# Patient Record
Sex: Female | Born: 1980 | Race: White | Hispanic: No | Smoking: Never smoker
Health system: Southern US, Community
[De-identification: ages and names within clinical notes are randomized; demographics above are authoritative.]

## PROBLEM LIST (undated history)

## (undated) DIAGNOSIS — G43909 Migraine, unspecified, not intractable, without status migrainosus: Secondary | ICD-10-CM

## (undated) DIAGNOSIS — J45909 Unspecified asthma, uncomplicated: Secondary | ICD-10-CM

## (undated) HISTORY — PX: MOUTH SURGERY: SHX715

---

## 2008-01-30 ENCOUNTER — Emergency Department (HOSPITAL_COMMUNITY): Admission: EM | Admit: 2008-01-30 | Discharge: 2008-01-30 | Payer: Self-pay | Admitting: Emergency Medicine

## 2011-03-07 ENCOUNTER — Encounter: Payer: Self-pay | Admitting: Family Medicine

## 2011-03-07 ENCOUNTER — Ambulatory Visit (INDEPENDENT_AMBULATORY_CARE_PROVIDER_SITE_OTHER): Payer: PRIVATE HEALTH INSURANCE | Admitting: Family Medicine

## 2011-03-07 DIAGNOSIS — G43109 Migraine with aura, not intractable, without status migrainosus: Secondary | ICD-10-CM | POA: Insufficient documentation

## 2011-03-07 DIAGNOSIS — G43909 Migraine, unspecified, not intractable, without status migrainosus: Secondary | ICD-10-CM

## 2011-03-07 MED ORDER — SUMATRIPTAN SUCCINATE 6 MG/0.5ML ~~LOC~~ SOLN
6.0000 mg | Freq: Once | SUBCUTANEOUS | Status: AC
  Administered 2011-03-07: 6 mg via SUBCUTANEOUS

## 2011-03-07 MED ORDER — SUMATRIPTAN SUCCINATE 100 MG PO TABS: 100.0000 mg | ORAL_TABLET | ORAL | Status: DC | PRN

## 2011-03-07 NOTE — Assessment & Plan Note (Addendum)
Given sumatriptan 6 mg SQ x 1 and ibuprofen 800 mg in clinic.  Given script for sumatriptan for abortive tx.  No red flags for medication overuse headache,  intracranial or infectious etiology.  Discussed risk of oral contraceptive in migraine with aura.

## 2011-03-07 NOTE — Progress Notes (Signed)
  Subjective:    Patient ID: Stacey Fox, female    DOB: 1981/02/07, 30 y.o.   MRN: [id number]  HPI History of migraines with aura.  Had another episode starting 5 days ago which was like typical migraine.  Has vision changes with aura of left arm numbness and neck pain.  This resolved by 2 days ago.  Today, had another migrained with nausea, diarrhea, photosensetivity, "jitteryness".  No fever.  No vision changes.    In past has been on prevention with topamax and lamictal.  Has not had a migraine in many months.  Review of Systems see hpi   Objective:   Physical Exam  Constitutional: She is oriented to person, place, and time. She appears well-developed and well-nourished.  HENT:  Head: Normocephalic.  Eyes: Conjunctivae and EOM are normal. Pupils are equal, round, and reactive to light.  Fundoscopic exam:      The right eye shows no arteriolar narrowing, no AV nicking, no hemorrhage and no papilledema. The right eye shows no red reflex.      The left eye shows no arteriolar narrowing, no AV nicking, no hemorrhage and no papilledema. The left eye shows no red reflex. Cardiovascular: Normal rate and regular rhythm.   No murmur heard. Pulmonary/Chest: Effort normal and breath sounds normal. She has no wheezes. She has no rales.  Abdominal: Soft. She exhibits no distension.  Neurological: She is alert and oriented to person, place, and time. She displays normal reflexes. No cranial nerve deficit. She exhibits normal muscle tone. Coordination normal.          Assessment & Plan:

## 2011-03-15 ENCOUNTER — Ambulatory Visit (INDEPENDENT_AMBULATORY_CARE_PROVIDER_SITE_OTHER): Payer: PRIVATE HEALTH INSURANCE | Admitting: Family Medicine

## 2011-03-15 VITALS — BP 119/78 | HR 102 | Temp 100.2°F | Wt 133.8 lb

## 2011-03-15 DIAGNOSIS — J029 Acute pharyngitis, unspecified: Secondary | ICD-10-CM

## 2011-03-15 DIAGNOSIS — J020 Streptococcal pharyngitis: Secondary | ICD-10-CM

## 2011-03-15 MED ORDER — CEPHALEXIN 500 MG PO CAPS: 500.0000 mg | ORAL_CAPSULE | Freq: Two times a day (BID) | ORAL | Status: AC

## 2011-03-15 NOTE — Patient Instructions (Signed)
I hope you feel better!  - Dr. Wallene Huh     Strep Infections Streptococcal (strep) infections are caused by streptococcal germs (bacteria). Strep infections are very contagious. Strep infections can occur in:  Ears.  The nose.   The throat.   Sinuses.   Skin.   Blood.  Lungs.   Spinal fluid.   Urine.   Strep throat is the most common bacterial infection in children. The symptoms of a Strep infection usually get better in 2 to 3 days after starting medicine that kills germs (antibiotics). Strep is usually not contagious after 36 to 48 hours of antibiotic treatment. Strep infections that are not treated can cause serious complications. These include gland infections, throat abscess, rheumatic fever and kidney disease. DIAGNOSIS The diagnosis of strep is made by:  A culture for the strep germ.  TREATMENT These infections require oral antibiotics for a full 10 days, an antibiotic shot or antibiotics given into the vein (intravenous, IV). HOME CARE INSTRUCTIONS  Be sure to finish all antibiotics even if feeling better.   Only take over-the-counter medicines for pain, discomfort and or fever, as directed by your caregiver.   Close contacts that have a fever, sore throat or illness symptoms should see their caregiver right away.   You or your child may return to work, school or daycare if the fever and pain are better in 2 to 3 days after starting antibiotics.  SEEK MEDICAL CARE IF:  You or your child has an oral temperature above 102 F (38.9 C).   Your baby is older than 3 months with a rectal temperature of 100.5 F (38.1 C) or higher for more than 1 day.   You or your child is not better in 3 days.  SEEK IMMEDIATE MEDICAL CARE IF:  You or your child has an oral temperature above 102 F (38.9 C), not controlled by medicine.   Your baby is older than 3 months with a rectal temperature of 102 F (38.9 C) or higher.   Your baby is 81 months old or younger with a rectal  temperature of 100.4 F (38 C) or higher.   There is a spreading rash.   There is difficulty swallowing or breathing.   There is increased pain or swelling.  Document Released: 01/04/2005 Document Re-Released: 05/17/2010 [employer] Patient Information 2011 [place], [place].

## 2011-03-15 NOTE — Progress Notes (Signed)
  Subjective:    Patient ID: Stacey Fox, female    DOB: 1981/05/27, 30 y.o.   MRN: [id number]  Sore Throat  This is a new problem. Episode onset: 2 days ago  The problem has been gradually worsening. Neither side of throat is experiencing more pain than the other. The maximum temperature recorded prior to her arrival was 100 - 100.9 F. The fever has been present for 1 to 2 days. The pain is moderate. Associated symptoms include neck pain, swollen glands and trouble swallowing. Pertinent negatives include no abdominal pain, congestion, coughing, diarrhea, drooling, ear discharge, ear pain, headaches, hoarse voice, plugged ear sensation, shortness of breath, stridor or vomiting. She has had exposure to strep. She has had no exposure to mono. She has tried acetaminophen, NSAIDs and oral narcotic analgesics for the symptoms. The treatment provided mild relief.      Review of Systems  HENT: Positive for trouble swallowing and neck pain. Negative for ear pain, congestion, hoarse voice, drooling and ear discharge.   Respiratory: Negative for cough, shortness of breath and stridor.   Gastrointestinal: Negative for vomiting, abdominal pain and diarrhea.  Neurological: Negative for headaches.       Objective:   Physical Exam  Constitutional: She appears well-developed and well-nourished. No distress.       Appears to be feeling ill.   HENT:  Head: Normocephalic.  Right Ear: External ear normal.  Left Ear: External ear normal.  Nose: Nose normal.  Mouth/Throat: Oropharyngeal exudate present.  Eyes: Conjunctivae are normal. Right eye exhibits no discharge. Left eye exhibits no discharge.  Neck: Normal range of motion. Neck supple.  Pulmonary/Chest: Effort normal and breath sounds normal. No stridor. No respiratory distress. She has no wheezes.  Abdominal: Soft. Bowel sounds are normal.  Lymphadenopathy:    She has cervical adenopathy.  Neurological: She is alert.  Skin: Skin is warm and dry.           Assessment & Plan:

## 2011-03-15 NOTE — Assessment & Plan Note (Signed)
Strep positive. Handout on strep pharyngitis given. Patient with mild PCN allergy but reports that she has take Keflex in the past. Will treat with Keflex today. Red flags reviewed. Follow up prn.

## 2012-09-19 ENCOUNTER — Ambulatory Visit: Payer: Self-pay | Admitting: Internal Medicine

## 2012-09-19 ENCOUNTER — Ambulatory Visit: Payer: Self-pay | Admitting: Hematology and Oncology

## 2012-09-19 LAB — CBC CANCER CENTER
Basophil %: 0.3 %
Eosinophil #: 0 x10 3/mm (ref 0.0–0.7)
Eosinophil %: 0.4 %
HCT: 42.9 % (ref 35.0–47.0)
Lymphocyte %: 15.5 %
MCH: 30.1 pg (ref 26.0–34.0)
MCHC: 33.1 g/dL (ref 32.0–36.0)
Monocyte %: 9.7 %
Neutrophil #: 4.8 x10 3/mm (ref 1.4–6.5)
RDW: 13 % (ref 11.5–14.5)

## 2012-10-11 ENCOUNTER — Ambulatory Visit: Payer: Self-pay | Admitting: Internal Medicine

## 2012-10-11 ENCOUNTER — Ambulatory Visit: Payer: Self-pay | Admitting: Hematology and Oncology

## 2016-10-21 LAB — GLUCOSE, POCT (MANUAL RESULT ENTRY): POC Glucose: 96 mg/dl (ref 70–99)

## 2017-12-28 DIAGNOSIS — Z13220 Encounter for screening for lipoid disorders: Secondary | ICD-10-CM | POA: Diagnosis not present

## 2017-12-28 DIAGNOSIS — Z0000 Encounter for general adult medical examination without abnormal findings: Secondary | ICD-10-CM | POA: Diagnosis not present

## 2017-12-28 DIAGNOSIS — R109 Unspecified abdominal pain: Secondary | ICD-10-CM | POA: Diagnosis not present

## 2017-12-28 DIAGNOSIS — Z131 Encounter for screening for diabetes mellitus: Secondary | ICD-10-CM | POA: Diagnosis not present

## 2017-12-28 DIAGNOSIS — Z1151 Encounter for screening for human papillomavirus (HPV): Secondary | ICD-10-CM | POA: Diagnosis not present

## 2017-12-28 DIAGNOSIS — Z118 Encounter for screening for other infectious and parasitic diseases: Secondary | ICD-10-CM | POA: Diagnosis not present

## 2017-12-28 DIAGNOSIS — Z3200 Encounter for pregnancy test, result unknown: Secondary | ICD-10-CM | POA: Diagnosis not present

## 2017-12-28 DIAGNOSIS — Z01419 Encounter for gynecological examination (general) (routine) without abnormal findings: Secondary | ICD-10-CM | POA: Diagnosis not present

## 2017-12-28 DIAGNOSIS — Z1329 Encounter for screening for other suspected endocrine disorder: Secondary | ICD-10-CM | POA: Diagnosis not present

## 2017-12-28 DIAGNOSIS — R1031 Right lower quadrant pain: Secondary | ICD-10-CM | POA: Diagnosis not present

## 2017-12-28 DIAGNOSIS — Z130 Encounter for screening for diseases of the blood and blood-forming organs and certain disorders involving the immune mechanism: Secondary | ICD-10-CM | POA: Diagnosis not present

## 2017-12-28 DIAGNOSIS — Z6824 Body mass index (BMI) 24.0-24.9, adult: Secondary | ICD-10-CM | POA: Diagnosis not present

## 2018-01-16 DIAGNOSIS — B977 Papillomavirus as the cause of diseases classified elsewhere: Secondary | ICD-10-CM | POA: Diagnosis not present

## 2018-01-16 DIAGNOSIS — N72 Inflammatory disease of cervix uteri: Secondary | ICD-10-CM | POA: Diagnosis not present

## 2018-01-16 DIAGNOSIS — J302 Other seasonal allergic rhinitis: Secondary | ICD-10-CM | POA: Diagnosis not present

## 2018-01-16 DIAGNOSIS — Z3200 Encounter for pregnancy test, result unknown: Secondary | ICD-10-CM | POA: Diagnosis not present

## 2018-03-14 DIAGNOSIS — R1031 Right lower quadrant pain: Secondary | ICD-10-CM | POA: Diagnosis not present

## 2018-04-02 DIAGNOSIS — R194 Change in bowel habit: Secondary | ICD-10-CM | POA: Diagnosis not present

## 2018-04-02 DIAGNOSIS — R1031 Right lower quadrant pain: Secondary | ICD-10-CM | POA: Diagnosis not present

## 2018-04-02 DIAGNOSIS — Z1211 Encounter for screening for malignant neoplasm of colon: Secondary | ICD-10-CM | POA: Diagnosis not present

## 2018-04-02 DIAGNOSIS — K625 Hemorrhage of anus and rectum: Secondary | ICD-10-CM | POA: Diagnosis not present

## 2018-04-15 ENCOUNTER — Other Ambulatory Visit: Payer: Self-pay | Admitting: Gastroenterology

## 2018-04-15 DIAGNOSIS — R194 Change in bowel habit: Secondary | ICD-10-CM | POA: Diagnosis not present

## 2018-04-15 DIAGNOSIS — Z1211 Encounter for screening for malignant neoplasm of colon: Secondary | ICD-10-CM | POA: Diagnosis not present

## 2018-04-15 DIAGNOSIS — K625 Hemorrhage of anus and rectum: Secondary | ICD-10-CM | POA: Diagnosis not present

## 2018-04-15 DIAGNOSIS — R1031 Right lower quadrant pain: Secondary | ICD-10-CM

## 2018-07-05 ENCOUNTER — Encounter (HOSPITAL_COMMUNITY): Payer: Self-pay | Admitting: Emergency Medicine

## 2018-07-05 ENCOUNTER — Ambulatory Visit (HOSPITAL_COMMUNITY)
Admission: EM | Admit: 2018-07-05 | Discharge: 2018-07-05 | Disposition: A | Discharge status: Home / Self Care | Payer: BLUE CROSS/BLUE SHIELD | Attending: Family Medicine | Admitting: Family Medicine

## 2018-07-05 ENCOUNTER — Other Ambulatory Visit: Payer: Self-pay

## 2018-07-05 DIAGNOSIS — T148XXA Other injury of unspecified body region, initial encounter: Secondary | ICD-10-CM

## 2018-07-05 DIAGNOSIS — S41009A Unspecified open wound of unspecified shoulder, initial encounter: Secondary | ICD-10-CM | POA: Diagnosis not present

## 2018-07-05 DIAGNOSIS — L089 Local infection of the skin and subcutaneous tissue, unspecified: Secondary | ICD-10-CM

## 2018-07-05 MED ORDER — IBUPROFEN 800 MG PO TABS: 800.0000 mg | ORAL_TABLET | Freq: Three times a day (TID) | ORAL | 0 refills | Status: DC

## 2018-07-05 MED ORDER — LIDOCAINE HCL (PF) 1 % IJ SOLN
INTRAMUSCULAR | Status: AC
  Filled 2018-07-05: qty 30

## 2018-07-05 MED ORDER — DOXYCYCLINE HYCLATE 100 MG PO CAPS: 100.0000 mg | ORAL_CAPSULE | Freq: Two times a day (BID) | ORAL | 0 refills | Status: DC
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
## 2018-07-05 NOTE — Discharge Instructions (Signed)
Warm compresses to area Take doxycycline as prescribed Take ibuprofen as needed for pain.  Take with food Return if you fail to see improvement in 2 to 3 days

## 2018-07-05 NOTE — ED Triage Notes (Signed)
Patient states [facility] and was told she has an abcess on her back

## 2018-09-06 DIAGNOSIS — R0600 Dyspnea, unspecified: Secondary | ICD-10-CM | POA: Diagnosis not present

## 2018-09-06 DIAGNOSIS — J45998 Other asthma: Secondary | ICD-10-CM | POA: Diagnosis not present

## 2019-01-08 DIAGNOSIS — Z23 Encounter for immunization: Secondary | ICD-10-CM | POA: Diagnosis not present

## 2019-01-08 DIAGNOSIS — Z6825 Body mass index (BMI) 25.0-25.9, adult: Secondary | ICD-10-CM | POA: Diagnosis not present

## 2019-01-08 DIAGNOSIS — Z113 Encounter for screening for infections with a predominantly sexual mode of transmission: Secondary | ICD-10-CM | POA: Diagnosis not present

## 2019-01-08 DIAGNOSIS — Z01419 Encounter for gynecological examination (general) (routine) without abnormal findings: Secondary | ICD-10-CM | POA: Diagnosis not present

## 2019-01-08 DIAGNOSIS — Z8619 Personal history of other infectious and parasitic diseases: Secondary | ICD-10-CM | POA: Diagnosis not present

## 2019-01-08 DIAGNOSIS — Z1151 Encounter for screening for human papillomavirus (HPV): Secondary | ICD-10-CM | POA: Diagnosis not present

## 2019-01-08 DIAGNOSIS — R87612 Low grade squamous intraepithelial lesion on cytologic smear of cervix (LGSIL): Secondary | ICD-10-CM | POA: Diagnosis not present

## 2019-01-08 DIAGNOSIS — Z298 Encounter for other specified prophylactic measures: Secondary | ICD-10-CM | POA: Diagnosis not present

## 2019-01-30 DIAGNOSIS — Z3201 Encounter for pregnancy test, result positive: Secondary | ICD-10-CM | POA: Diagnosis not present

## 2019-02-03 DIAGNOSIS — Z3A01 Less than 8 weeks gestation of pregnancy: Secondary | ICD-10-CM | POA: Diagnosis not present

## 2019-02-03 DIAGNOSIS — O200 Threatened abortion: Secondary | ICD-10-CM | POA: Diagnosis not present

## 2019-02-03 DIAGNOSIS — O209 Hemorrhage in early pregnancy, unspecified: Secondary | ICD-10-CM | POA: Diagnosis not present

## 2019-02-05 DIAGNOSIS — O209 Hemorrhage in early pregnancy, unspecified: Secondary | ICD-10-CM | POA: Diagnosis not present

## 2019-02-05 DIAGNOSIS — Z3A01 Less than 8 weeks gestation of pregnancy: Secondary | ICD-10-CM | POA: Diagnosis not present

## 2019-02-10 DIAGNOSIS — J019 Acute sinusitis, unspecified: Secondary | ICD-10-CM | POA: Diagnosis not present

## 2019-02-12 DIAGNOSIS — O209 Hemorrhage in early pregnancy, unspecified: Secondary | ICD-10-CM | POA: Diagnosis not present

## 2019-02-12 DIAGNOSIS — Z3A01 Less than 8 weeks gestation of pregnancy: Secondary | ICD-10-CM | POA: Diagnosis not present

## 2019-05-16 ENCOUNTER — Ambulatory Visit (INDEPENDENT_AMBULATORY_CARE_PROVIDER_SITE_OTHER): Payer: BC Managed Care – PPO

## 2019-05-16 ENCOUNTER — Encounter: Payer: Self-pay | Admitting: Emergency Medicine

## 2019-05-16 ENCOUNTER — Ambulatory Visit
Admission: EM | Admit: 2019-05-16 | Discharge: 2019-05-16 | Disposition: A | Discharge status: Home / Self Care | Payer: BC Managed Care – PPO | Attending: Family Medicine | Admitting: Family Medicine

## 2019-05-16 DIAGNOSIS — W19XXXA Unspecified fall, initial encounter: Secondary | ICD-10-CM

## 2019-05-16 DIAGNOSIS — S6992XA Unspecified injury of left wrist, hand and finger(s), initial encounter: Secondary | ICD-10-CM

## 2019-05-16 HISTORY — DX: Unspecified asthma, uncomplicated: J45.909
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
## 2019-05-16 NOTE — Discharge Instructions (Addendum)
Your x-ray did not show any fractures It appears that most of your symptoms have resolved and injuries are healing.  Follow up as needed for continued or worsening symptoms
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
## 2019-06-26 DIAGNOSIS — Z3169 Encounter for other general counseling and advice on procreation: Secondary | ICD-10-CM | POA: Diagnosis not present

## 2019-08-14 DIAGNOSIS — Z20828 Contact with and (suspected) exposure to other viral communicable diseases: Secondary | ICD-10-CM | POA: Diagnosis not present

## 2019-08-25 DIAGNOSIS — Z03818 Encounter for observation for suspected exposure to other biological agents ruled out: Secondary | ICD-10-CM | POA: Diagnosis not present

## 2019-09-01 DIAGNOSIS — Z03818 Encounter for observation for suspected exposure to other biological agents ruled out: Secondary | ICD-10-CM | POA: Diagnosis not present

## 2019-09-08 DIAGNOSIS — Z03818 Encounter for observation for suspected exposure to other biological agents ruled out: Secondary | ICD-10-CM | POA: Diagnosis not present

## 2019-09-15 DIAGNOSIS — Z03818 Encounter for observation for suspected exposure to other biological agents ruled out: Secondary | ICD-10-CM | POA: Diagnosis not present

## 2019-09-22 DIAGNOSIS — Z03818 Encounter for observation for suspected exposure to other biological agents ruled out: Secondary | ICD-10-CM | POA: Diagnosis not present

## 2019-09-29 DIAGNOSIS — Z03818 Encounter for observation for suspected exposure to other biological agents ruled out: Secondary | ICD-10-CM | POA: Diagnosis not present

## 2019-10-06 DIAGNOSIS — Z03818 Encounter for observation for suspected exposure to other biological agents ruled out: Secondary | ICD-10-CM | POA: Diagnosis not present

## 2019-10-27 DIAGNOSIS — Z03818 Encounter for observation for suspected exposure to other biological agents ruled out: Secondary | ICD-10-CM | POA: Diagnosis not present

## 2019-11-03 DIAGNOSIS — Z03818 Encounter for observation for suspected exposure to other biological agents ruled out: Secondary | ICD-10-CM | POA: Diagnosis not present

## 2019-11-04 DIAGNOSIS — Z3689 Encounter for other specified antenatal screening: Secondary | ICD-10-CM | POA: Diagnosis not present

## 2019-11-04 DIAGNOSIS — Z3A01 Less than 8 weeks gestation of pregnancy: Secondary | ICD-10-CM | POA: Diagnosis not present

## 2019-11-04 DIAGNOSIS — Z3200 Encounter for pregnancy test, result unknown: Secondary | ICD-10-CM | POA: Diagnosis not present

## 2019-11-04 DIAGNOSIS — O09291 Supervision of pregnancy with other poor reproductive or obstetric history, first trimester: Secondary | ICD-10-CM | POA: Diagnosis not present

## 2019-11-05 DIAGNOSIS — Z3A01 Less than 8 weeks gestation of pregnancy: Secondary | ICD-10-CM | POA: Diagnosis not present

## 2019-11-05 DIAGNOSIS — O09291 Supervision of pregnancy with other poor reproductive or obstetric history, first trimester: Secondary | ICD-10-CM | POA: Diagnosis not present

## 2019-11-10 DIAGNOSIS — Z03818 Encounter for observation for suspected exposure to other biological agents ruled out: Secondary | ICD-10-CM | POA: Diagnosis not present

## 2019-11-13 DIAGNOSIS — Z3201 Encounter for pregnancy test, result positive: Secondary | ICD-10-CM | POA: Diagnosis not present

## 2019-11-17 DIAGNOSIS — Z03818 Encounter for observation for suspected exposure to other biological agents ruled out: Secondary | ICD-10-CM | POA: Diagnosis not present

## 2019-11-24 DIAGNOSIS — Z03818 Encounter for observation for suspected exposure to other biological agents ruled out: Secondary | ICD-10-CM | POA: Diagnosis not present

## 2019-11-27 DIAGNOSIS — Z3201 Encounter for pregnancy test, result positive: Secondary | ICD-10-CM | POA: Diagnosis not present

## 2019-12-01 DIAGNOSIS — Z03818 Encounter for observation for suspected exposure to other biological agents ruled out: Secondary | ICD-10-CM | POA: Diagnosis not present
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
## 2019-12-08 DIAGNOSIS — Z03818 Encounter for observation for suspected exposure to other biological agents ruled out: Secondary | ICD-10-CM | POA: Diagnosis not present

## 2019-12-15 DIAGNOSIS — Z03818 Encounter for observation for suspected exposure to other biological agents ruled out: Secondary | ICD-10-CM | POA: Diagnosis not present

## 2019-12-17 DIAGNOSIS — Z3A10 10 weeks gestation of pregnancy: Secondary | ICD-10-CM | POA: Diagnosis not present

## 2019-12-17 DIAGNOSIS — O09299 Supervision of pregnancy with other poor reproductive or obstetric history, unspecified trimester: Secondary | ICD-10-CM | POA: Diagnosis not present

## 2019-12-17 DIAGNOSIS — Z118 Encounter for screening for other infectious and parasitic diseases: Secondary | ICD-10-CM | POA: Diagnosis not present

## 2019-12-17 DIAGNOSIS — Z3689 Encounter for other specified antenatal screening: Secondary | ICD-10-CM | POA: Diagnosis not present

## 2019-12-17 DIAGNOSIS — Z3682 Encounter for antenatal screening for nuchal translucency: Secondary | ICD-10-CM | POA: Diagnosis not present

## 2019-12-17 DIAGNOSIS — O09521 Supervision of elderly multigravida, first trimester: Secondary | ICD-10-CM | POA: Diagnosis not present

## 2019-12-17 LAB — OB RESULTS CONSOLE GC/CHLAMYDIA
Chlamydia: NEGATIVE
Gonorrhea: NEGATIVE

## 2019-12-17 LAB — OB RESULTS CONSOLE RUBELLA ANTIBODY, IGM: Rubella: IMMUNE

## 2019-12-17 LAB — OB RESULTS CONSOLE HIV ANTIBODY (ROUTINE TESTING): HIV: NONREACTIVE

## 2019-12-17 LAB — OB RESULTS CONSOLE HEPATITIS B SURFACE ANTIGEN: Hepatitis B Surface Ag: NEGATIVE

## 2019-12-22 DIAGNOSIS — Z03818 Encounter for observation for suspected exposure to other biological agents ruled out: Secondary | ICD-10-CM | POA: Diagnosis not present

## 2019-12-29 DIAGNOSIS — Z3A12 12 weeks gestation of pregnancy: Secondary | ICD-10-CM | POA: Diagnosis not present

## 2019-12-29 DIAGNOSIS — Z03818 Encounter for observation for suspected exposure to other biological agents ruled out: Secondary | ICD-10-CM | POA: Diagnosis not present

## 2019-12-29 DIAGNOSIS — O09522 Supervision of elderly multigravida, second trimester: Secondary | ICD-10-CM | POA: Diagnosis not present

## 2019-12-29 DIAGNOSIS — Z3682 Encounter for antenatal screening for nuchal translucency: Secondary | ICD-10-CM | POA: Diagnosis not present

## 2020-01-09 DIAGNOSIS — Z03818 Encounter for observation for suspected exposure to other biological agents ruled out: Secondary | ICD-10-CM | POA: Diagnosis not present

## 2020-01-12 DIAGNOSIS — Z03818 Encounter for observation for suspected exposure to other biological agents ruled out: Secondary | ICD-10-CM | POA: Diagnosis not present

## 2020-01-21 DIAGNOSIS — Z03818 Encounter for observation for suspected exposure to other biological agents ruled out: Secondary | ICD-10-CM | POA: Diagnosis not present

## 2020-01-28 DIAGNOSIS — O09522 Supervision of elderly multigravida, second trimester: Secondary | ICD-10-CM | POA: Diagnosis not present

## 2020-01-28 DIAGNOSIS — Z3A16 16 weeks gestation of pregnancy: Secondary | ICD-10-CM | POA: Diagnosis not present

## 2020-01-28 DIAGNOSIS — Z361 Encounter for antenatal screening for raised alphafetoprotein level: Secondary | ICD-10-CM | POA: Diagnosis not present

## 2020-02-09 DIAGNOSIS — Z03818 Encounter for observation for suspected exposure to other biological agents ruled out: Secondary | ICD-10-CM | POA: Diagnosis not present

## 2020-02-17 DIAGNOSIS — Z3A19 19 weeks gestation of pregnancy: Secondary | ICD-10-CM | POA: Diagnosis not present

## 2020-02-17 DIAGNOSIS — O09522 Supervision of elderly multigravida, second trimester: Secondary | ICD-10-CM | POA: Diagnosis not present

## 2020-02-17 DIAGNOSIS — Z362 Encounter for other antenatal screening follow-up: Secondary | ICD-10-CM | POA: Diagnosis not present

## 2020-03-16 DIAGNOSIS — O09522 Supervision of elderly multigravida, second trimester: Secondary | ICD-10-CM | POA: Diagnosis not present

## 2020-03-16 DIAGNOSIS — Z3A23 23 weeks gestation of pregnancy: Secondary | ICD-10-CM | POA: Diagnosis not present

## 2020-03-16 DIAGNOSIS — N898 Other specified noninflammatory disorders of vagina: Secondary | ICD-10-CM | POA: Diagnosis not present

## 2020-03-16 DIAGNOSIS — R300 Dysuria: Secondary | ICD-10-CM | POA: Diagnosis not present

## 2020-04-19 DIAGNOSIS — Z3689 Encounter for other specified antenatal screening: Secondary | ICD-10-CM | POA: Diagnosis not present

## 2020-04-19 DIAGNOSIS — O09523 Supervision of elderly multigravida, third trimester: Secondary | ICD-10-CM | POA: Diagnosis not present

## 2020-04-19 DIAGNOSIS — Z23 Encounter for immunization: Secondary | ICD-10-CM | POA: Diagnosis not present

## 2020-04-19 DIAGNOSIS — Z3A28 28 weeks gestation of pregnancy: Secondary | ICD-10-CM | POA: Diagnosis not present

## 2020-04-19 LAB — OB RESULTS CONSOLE RPR: RPR: NONREACTIVE

## 2020-05-03 DIAGNOSIS — O09523 Supervision of elderly multigravida, third trimester: Secondary | ICD-10-CM | POA: Diagnosis not present

## 2020-05-03 DIAGNOSIS — Z3A30 30 weeks gestation of pregnancy: Secondary | ICD-10-CM | POA: Diagnosis not present

## 2020-05-19 DIAGNOSIS — O09299 Supervision of pregnancy with other poor reproductive or obstetric history, unspecified trimester: Secondary | ICD-10-CM | POA: Diagnosis not present

## 2020-05-19 DIAGNOSIS — O09523 Supervision of elderly multigravida, third trimester: Secondary | ICD-10-CM | POA: Diagnosis not present

## 2020-05-19 DIAGNOSIS — Z3A32 32 weeks gestation of pregnancy: Secondary | ICD-10-CM | POA: Diagnosis not present

## 2020-05-19 DIAGNOSIS — K219 Gastro-esophageal reflux disease without esophagitis: Secondary | ICD-10-CM | POA: Diagnosis not present

## 2020-06-08 DIAGNOSIS — Z3A35 35 weeks gestation of pregnancy: Secondary | ICD-10-CM | POA: Diagnosis not present

## 2020-06-08 DIAGNOSIS — O09523 Supervision of elderly multigravida, third trimester: Secondary | ICD-10-CM | POA: Diagnosis not present

## 2020-06-08 DIAGNOSIS — Z3685 Encounter for antenatal screening for Streptococcus B: Secondary | ICD-10-CM | POA: Diagnosis not present

## 2020-06-08 LAB — OB RESULTS CONSOLE GBS: GBS: POSITIVE

## 2020-06-17 DIAGNOSIS — O09523 Supervision of elderly multigravida, third trimester: Secondary | ICD-10-CM | POA: Diagnosis not present

## 2020-06-17 DIAGNOSIS — Z3A36 36 weeks gestation of pregnancy: Secondary | ICD-10-CM | POA: Diagnosis not present

## 2020-06-22 DIAGNOSIS — O3663X0 Maternal care for excessive fetal growth, third trimester, not applicable or unspecified: Secondary | ICD-10-CM | POA: Diagnosis not present

## 2020-06-22 DIAGNOSIS — Z3A37 37 weeks gestation of pregnancy: Secondary | ICD-10-CM | POA: Diagnosis not present

## 2020-06-28 ENCOUNTER — Encounter (HOSPITAL_COMMUNITY): Payer: Self-pay | Admitting: Obstetrics

## 2020-06-28 ENCOUNTER — Inpatient Hospital Stay (HOSPITAL_COMMUNITY): Payer: BC Managed Care – PPO | Admitting: Anesthesiology

## 2020-06-28 ENCOUNTER — Inpatient Hospital Stay (HOSPITAL_COMMUNITY)
Admission: AD | Admit: 2020-06-28 | Discharge: 2020-07-01 | DRG: 787 | Disposition: A | Discharge status: Home / Self Care | Payer: BC Managed Care – PPO | Attending: Obstetrics & Gynecology | Admitting: Obstetrics & Gynecology

## 2020-06-28 ENCOUNTER — Encounter (HOSPITAL_COMMUNITY): Payer: Self-pay | Admitting: *Deleted

## 2020-06-28 ENCOUNTER — Inpatient Hospital Stay (EMERGENCY_DEPARTMENT_HOSPITAL)
Admission: AD | Admit: 2020-06-28 | Discharge: 2020-06-28 | Disposition: A | Discharge status: Home / Self Care | Payer: BC Managed Care – PPO | Source: Home / Self Care | Attending: Obstetrics and Gynecology | Admitting: Obstetrics and Gynecology

## 2020-06-28 DIAGNOSIS — O471 False labor at or after 37 completed weeks of gestation: Secondary | ICD-10-CM | POA: Insufficient documentation

## 2020-06-28 DIAGNOSIS — Z23 Encounter for immunization: Secondary | ICD-10-CM | POA: Diagnosis not present

## 2020-06-28 DIAGNOSIS — K219 Gastro-esophageal reflux disease without esophagitis: Secondary | ICD-10-CM | POA: Diagnosis not present

## 2020-06-28 DIAGNOSIS — O9962 Diseases of the digestive system complicating childbirth: Secondary | ICD-10-CM | POA: Diagnosis not present

## 2020-06-28 DIAGNOSIS — Z412 Encounter for routine and ritual male circumcision: Secondary | ICD-10-CM | POA: Diagnosis not present

## 2020-06-28 DIAGNOSIS — Z3689 Encounter for other specified antenatal screening: Secondary | ICD-10-CM

## 2020-06-28 DIAGNOSIS — Z3A38 38 weeks gestation of pregnancy: Secondary | ICD-10-CM

## 2020-06-28 DIAGNOSIS — Z880 Allergy status to penicillin: Secondary | ICD-10-CM | POA: Diagnosis not present

## 2020-06-28 DIAGNOSIS — O99824 Streptococcus B carrier state complicating childbirth: Secondary | ICD-10-CM | POA: Diagnosis not present

## 2020-06-28 DIAGNOSIS — O09513 Supervision of elderly primigravida, third trimester: Secondary | ICD-10-CM | POA: Insufficient documentation

## 2020-06-28 DIAGNOSIS — Z20822 Contact with and (suspected) exposure to covid-19: Secondary | ICD-10-CM | POA: Diagnosis not present

## 2020-06-28 DIAGNOSIS — O4693 Antepartum hemorrhage, unspecified, third trimester: Secondary | ICD-10-CM | POA: Diagnosis not present

## 2020-06-28 DIAGNOSIS — O3663X0 Maternal care for excessive fetal growth, third trimester, not applicable or unspecified: Principal | ICD-10-CM | POA: Diagnosis present

## 2020-06-28 DIAGNOSIS — O99892 Other specified diseases and conditions complicating childbirth: Secondary | ICD-10-CM | POA: Diagnosis present

## 2020-06-28 DIAGNOSIS — D62 Acute posthemorrhagic anemia: Secondary | ICD-10-CM | POA: Diagnosis not present

## 2020-06-28 DIAGNOSIS — O9081 Anemia of the puerperium: Secondary | ICD-10-CM | POA: Diagnosis not present

## 2020-06-28 DIAGNOSIS — O339 Maternal care for disproportion, unspecified: Secondary | ICD-10-CM | POA: Diagnosis not present

## 2020-06-28 DIAGNOSIS — Z3A00 Weeks of gestation of pregnancy not specified: Secondary | ICD-10-CM | POA: Diagnosis not present

## 2020-06-28 HISTORY — DX: Migraine, unspecified, not intractable, without status migrainosus: G43.909

## 2020-06-28 LAB — CBC
HCT: 39.8 % (ref 36.0–46.0)
Hemoglobin: 12.9 g/dL (ref 12.0–15.0)
MCH: 29.7 pg (ref 26.0–34.0)
MCHC: 32.4 g/dL (ref 30.0–36.0)
MCV: 91.5 fL (ref 80.0–100.0)
Platelets: 191 10*3/uL (ref 150–400)
RBC: 4.35 MIL/uL (ref 3.87–5.11)
RDW: 14 % (ref 11.5–15.5)
WBC: 10.9 10*3/uL — ABNORMAL HIGH (ref 4.0–10.5)
nRBC: 0 % (ref 0.0–0.2)

## 2020-06-28 LAB — TYPE AND SCREEN
ABO/RH(D): O POS
Antibody Screen: NEGATIVE

## 2020-06-28 LAB — POCT FERN TEST: POCT Fern Test: NEGATIVE

## 2020-06-28 LAB — ABO/RH: ABO/RH(D): O POS

## 2020-06-28 LAB — SARS CORONAVIRUS 2 BY RT PCR (HOSPITAL ORDER, PERFORMED IN ~~LOC~~ HOSPITAL LAB): SARS Coronavirus 2: NEGATIVE

## 2020-06-28 MED ORDER — LACTATED RINGERS IV SOLN: 500.0000 mL | Freq: Once | INTRAVENOUS | Status: DC

## 2020-06-28 MED ORDER — CEFAZOLIN SODIUM-DEXTROSE 1-4 GM/50ML-% IV SOLN
1.0000 g | Freq: Three times a day (TID) | INTRAVENOUS | Status: DC
  Administered 2020-06-29 (×2): 1 g via INTRAVENOUS
  Filled 2020-06-28 (×4): qty 50

## 2020-06-28 MED ORDER — LACTATED RINGERS IV SOLN: INTRAVENOUS | Status: DC

## 2020-06-28 MED ORDER — LIDOCAINE HCL (PF) 1 % IJ SOLN
INTRAMUSCULAR | Status: DC | PRN
  Administered 2020-06-28: 3 mL via EPIDURAL
  Administered 2020-06-28: 7 mL via EPIDURAL

## 2020-06-28 MED ORDER — LIDOCAINE HCL (PF) 1 % IJ SOLN: 30.0000 mL | INTRAMUSCULAR | Status: DC | PRN

## 2020-06-28 MED ORDER — FLEET ENEMA 7-19 GM/118ML RE ENEM: 1.0000 | ENEMA | RECTAL | Status: DC | PRN

## 2020-06-28 MED ORDER — SOD CITRATE-CITRIC ACID 500-334 MG/5ML PO SOLN
30.0000 mL | ORAL | Status: DC | PRN
  Administered 2020-06-29: 30 mL via ORAL
  Filled 2020-06-28: qty 30

## 2020-06-28 MED ORDER — CLINDAMYCIN PHOSPHATE 900 MG/50ML IV SOLN: 900.0000 mg | Freq: Three times a day (TID) | INTRAVENOUS | Status: DC

## 2020-06-28 MED ORDER — OXYCODONE-ACETAMINOPHEN 5-325 MG PO TABS: 2.0000 | ORAL_TABLET | ORAL | Status: DC | PRN

## 2020-06-28 MED ORDER — FENTANYL CITRATE (PF) 100 MCG/2ML IJ SOLN: 50.0000 ug | Freq: Once | INTRAMUSCULAR | Status: DC

## 2020-06-28 MED ORDER — PHENYLEPHRINE 40 MCG/ML (10ML) SYRINGE FOR IV PUSH (FOR BLOOD PRESSURE SUPPORT): 80.0000 ug | PREFILLED_SYRINGE | INTRAVENOUS | Status: DC | PRN

## 2020-06-28 MED ORDER — DIPHENHYDRAMINE HCL 50 MG/ML IJ SOLN: 12.5000 mg | INTRAMUSCULAR | Status: DC | PRN

## 2020-06-28 MED ORDER — CEFAZOLIN SODIUM-DEXTROSE 2-4 GM/100ML-% IV SOLN: 2.0000 g | Freq: Four times a day (QID) | INTRAVENOUS | Status: DC

## 2020-06-28 MED ORDER — ZOLPIDEM TARTRATE 5 MG PO TABS: 5.0000 mg | ORAL_TABLET | Freq: Every evening | ORAL | 1 refills | Status: DC | PRN

## 2020-06-28 MED ORDER — EPHEDRINE 5 MG/ML INJ: 10.0000 mg | INTRAVENOUS | Status: DC | PRN

## 2020-06-28 MED ORDER — ONDANSETRON HCL 4 MG/2ML IJ SOLN: 4.0000 mg | Freq: Four times a day (QID) | INTRAMUSCULAR | Status: DC | PRN

## 2020-06-28 MED ORDER — LACTATED RINGERS IV SOLN: 500.0000 mL | INTRAVENOUS | Status: DC | PRN

## 2020-06-28 MED ORDER — ZOLPIDEM TARTRATE 5 MG PO TABS: 5.0000 mg | ORAL_TABLET | Freq: Every evening | ORAL | 0 refills | Status: DC | PRN

## 2020-06-28 MED ORDER — ACETAMINOPHEN 325 MG PO TABS: 650.0000 mg | ORAL_TABLET | ORAL | Status: DC | PRN

## 2020-06-28 MED ORDER — OXYTOCIN BOLUS FROM INFUSION: 333.0000 mL | Freq: Once | INTRAVENOUS | Status: DC

## 2020-06-28 MED ORDER — SODIUM CHLORIDE (PF) 0.9 % IJ SOLN
INTRAMUSCULAR | Status: DC | PRN
  Administered 2020-06-28: 12 mL/h via EPIDURAL

## 2020-06-28 MED ORDER — CEFAZOLIN SODIUM-DEXTROSE 2-4 GM/100ML-% IV SOLN
2.0000 g | Freq: Once | INTRAVENOUS | Status: AC
  Administered 2020-06-28: 2 g via INTRAVENOUS
  Filled 2020-06-28: qty 100

## 2020-06-28 MED ORDER — PANTOPRAZOLE SODIUM 20 MG PO TBEC
20.0000 mg | DELAYED_RELEASE_TABLET | Freq: Once | ORAL | Status: AC
  Administered 2020-06-29: 20 mg via ORAL
  Filled 2020-06-28 (×2): qty 1

## 2020-06-28 MED ORDER — OXYTOCIN-SODIUM CHLORIDE 30-0.9 UT/500ML-% IV SOLN
2.5000 [IU]/h | INTRAVENOUS | Status: DC
  Filled 2020-06-28: qty 500

## 2020-06-28 MED ORDER — FENTANYL-BUPIVACAINE-NACL 0.5-0.125-0.9 MG/250ML-% EP SOLN
12.0000 mL/h | EPIDURAL | Status: DC | PRN
  Administered 2020-06-29: 12 mL/h via EPIDURAL
  Filled 2020-06-28 (×2): qty 250

## 2020-06-28 MED ORDER — OXYCODONE-ACETAMINOPHEN 5-325 MG PO TABS: 1.0000 | ORAL_TABLET | ORAL | Status: DC | PRN

## 2020-06-28 NOTE — H&P (Signed)
Stacey Fox is a 39 y.o. female presenting for early labor with vaginal bleeding, UCs strong. Requesting pain med. Pt reported fluid leak, per MAU provider, membranes intact but copious brown to slight red bloody discharge AMA, G2P0, uncomplicated pregnancy. LGA by sono, last sono 36 wks, EFW 7'10" 99% and AC 99%, AFI 17 cm. Vx GBS+, PCN allergy but can take Ancef.    OB History    Gravida  1   Para      Term      Preterm      AB      Living        SAB      TAB      Ectopic      Multiple      Live Births             Past Medical History:  Diagnosis Date  . Asthma    exercise induced   . Migraines    Past Surgical History:  Procedure Laterality Date  . MOUTH SURGERY     Family History: family history includes Cancer in her maternal grandfather and paternal grandfather; Diabetes in her paternal grandmother; Heart disease in her father; Hypertension in her father, maternal grandfather, and paternal grandmother. Social History:  reports that she has never smoked. She has never used smokeless tobacco. She reports previous alcohol use. She reports previous drug use.     Maternal Diabetes: No Genetic Screening: Normal NT, AFP1 nl  Maternal Ultrasounds/Referrals: Normal Fetal Ultrasounds or other Referrals:  None Maternal Substance Abuse:  No Significant Maternal Medications:  None Significant Maternal Lab Results:  Group B Strep positive Other Comments:  None  Review of Systems History Dilation: 3 Effacement (%): 90 Station: Ballotable Exam by:: Dr Juliene Pina  Blood pressure 122/73, pulse 80, temperature 98.5 F (36.9 C), temperature source Oral, resp. rate 18, height 5\' 3"  (1.6 m), weight 85.9 kg, SpO2 100 %. Exam Physical Exam  Physical exam:  A&O x 3, no acute distress. Pleasant HEENT neg, no thyromegaly Lungs CTA bilat CV RRR, S1S2 normal Abdo soft, non tender, non acute Extr no edema/ tenderness Pelvic 3 /90%/ -3 ballotable/ BBOW with UCs Vx FHT   130s + accels no decels mod variab cat I Toco q 3 min, spontaneous   Prenatal labs: ABO, Rh: --/--/O POS Performed at [facility], [address], [place], [place]  [phone]/19 1544) Antibody: NEG (07/19 1519) Rubella:  Immune RPR:   NR HBsAg:   Neg HIV:   Neg GBS:   Positive  Glucola nl   Assessment/Plan: 39 yo, G2P0, 38.2 wks, LMP=1st trim sono. Early labor with heavy bloody show, labor admit. Expectant plan. Epidural requested, AROM after Ancef, continue q 8hrs. EFW 8.1/2 - 9 lbs (per 7'10" 3 wks back), high station, assess in active labor   30 06/28/2020, 4:53 PM

## 2020-06-28 NOTE — MAU Note (Signed)
Pt reports to MAU c/o ctx every 3 min. Pt reports some bloody show yesterday that has been ongoing today. +FM. Pt denies LOF.   189.3lb

## 2020-06-28 NOTE — MAU Provider Note (Signed)
First Provider Initiated Contact with Patient 06/28/20 1343       S: Ms. Stacey Fox is a 39 y.o. G1P0 at [redacted]w[redacted]d  who presents to MAU today complaining of leaking of fluid since being discharged from MAU earlier today. Was seen early this morning for a labor evaluation; cervix was dilated 2 cm. Since then has felt like she's been leaking; when she wipes she sees brown discharge. She states contractions have continued all day and are starting to get worse. Reports good fetal movement.     O: BP 118/73 (BP Location: Right Arm)   Pulse 87   Temp 98.7 F (37.1 C) (Oral)   Resp 18   SpO2 100%  GENERAL: Well-developed, well-nourished female in no acute distress.  HEAD: Normocephalic, atraumatic.  CHEST: Normal effort of breathing, regular heart rate ABDOMEN: Soft, nontender, gravid PELVIC: SSE: NEFG. Pooling dark red blood.   Cervical exam:  Dilation: 2 Effacement (%): 80 Cervical Position: Posterior Station: -3 Exam by:: J.Bellamy, Rn   Fetal Monitoring: Baseline: 130 Variability: moderate Accelerations: 15x15 Decelerations: none Contractions: Q3-6 minutes  Results for orders placed or performed during the hospital encounter of 06/28/20 (from the past 24 hour(s))  Fern Test     Status: None   Collection Time: 06/28/20  1:32 PM  Result Value Ref Range   POCT Fern Test Negative = intact amniotic membranes    MDM On exam, pooling dark red blood. Per RN who performed cervical exam, brown on glove. Patient has brown staining on perineum. Crist Fat is negative. Cervix remains unchanged. Patient requesting something for pain.   Ctx palpate moderate. Abdomen relaxed between contractions.   Spoke with Dr. Juliene Pina regarding patient. Admit to birthing suites. I will place orders as Dr. Juliene Pina is currently in the OR.  A: 1. Vaginal bleeding in pregnancy, third trimester   2. [redacted] weeks gestation of pregnancy      P: Admit to birthing suites  IV pain medication ordered GBS pos with  penicillin allergy - sensitive to clindamycin   Judeth Horn, NP 06/28/2020 3:12 PM

## 2020-06-28 NOTE — Anesthesia Preprocedure Evaluation (Addendum)
Anesthesia Evaluation  Patient identified by MRN, date of birth, ID band Patient awake    Reviewed: Allergy & Precautions, H&P , NPO status , Patient's Chart, lab work & pertinent test results  History of Anesthesia Complications Negative for: history of anesthetic complications  Airway Mallampati: II  TM Distance: >3 FB Neck ROM: full    Dental no notable dental hx.    Pulmonary neg pulmonary ROS,    Pulmonary exam normal        Cardiovascular negative cardio ROS Normal cardiovascular exam Rhythm:regular Rate:Normal     Neuro/Psych negative neurological ROS  negative psych ROS   GI/Hepatic negative GI ROS, Neg liver ROS,   Endo/Other  negative endocrine ROS  Renal/GU negative Renal ROS  negative genitourinary   Musculoskeletal   Abdominal   Peds  Hematology negative hematology ROS (+)   Anesthesia Other Findings   Reproductive/Obstetrics (+) Pregnancy                             Anesthesia Physical Anesthesia Plan  ASA: II  Anesthesia Plan: Epidural   Post-op Pain Management:    Induction:   PONV Risk Score and Plan: Ondansetron and Treatment may vary due to age or medical condition  Airway Management Planned: Natural Airway  Additional Equipment: None  Intra-op Plan:   Post-operative Plan:   Informed Consent: I have reviewed the patients History and Physical, chart, labs and discussed the procedure including the risks, benefits and alternatives for the proposed anesthesia with the patient or authorized representative who has indicated his/her understanding and acceptance.     Dental advisory given  Plan Discussed with: CRNA  Anesthesia Plan Comments:        Anesthesia Quick Evaluation

## 2020-06-28 NOTE — Discharge Instructions (Signed)
First Stage of Labor °Labor is your body's natural process of moving your baby and other structures, including the placenta and umbilical cord, out of your uterus. There are three stages of labor. How long each stage lasts is different for every woman. But certain events happen during each stage that are the same for everyone. °· The first stage starts when true labor begins. This stage ends when your cervix, which is the opening from your uterus into your vagina, is completely open (dilated). °· The second stage begins when your cervix is fully dilated and you start pushing. This stage ends when your baby is born. °· The third stage is the delivery of the organ that nourished your baby during pregnancy (placenta). °First stage of labor °As your due date gets closer, you may start to notice certain physical changes that mean labor is going to start soon. You may feel that your baby has dropped lower into your pelvis. You may experience irregular, often painless, contractions that go away when you walk around or lie down (Braxton Hicks contractions). This is also called false labor. °The first stage of labor begins when you start having contractions that come at regular (evenly spaced) intervals and your cervix starts to get thinner and wider in preparation for your baby to pass through. Birth care providers measure the dilation of your cervix in centimeters (cm). One centimeter is a little less than one-half of an inch. The first stage ends when your cervix is dilated to 10 cm. The first stage of labor is divided into three phases: °· Early phase. °· Active phase. °· Transitional phase. °The length of the first stage of labor varies. It may be longer if this is your first pregnancy. You may spend most of this stage at home trying to relax and stay comfortable. °How does this affect me? °During the first stage of labor, you will move through three phases. °What happens in the early phase? °· You will start to have  regular contractions that last 30-60 seconds. Contractions may come every 5-20 minutes. Keep track of your contractions and call your birth care provider. °· Your water may break during this phase. °· You may notice a clear or slightly bloody discharge of mucus (mucus plug) from your vagina. °· Your cervix will dilate to 3-6 cm. °What happens in the active phase? °The active phase usually lasts 3-5 hours. You may go to the hospital or birth center around this time. During the active phase: °· Your contractions will become stronger, longer, and more uncomfortable. °· Your contractions may last 45-90 seconds and come every 3-5 minutes. °· You may feel lower back pain. °· Your birth care providers may examine your cervix and feel your belly to find the position of your baby. °· You may have a monitor strapped to your belly to measure your contractions and your baby's heart rate. °· You may start using your pain management options. °· Your cervix may be dilated to 6 cm and may start to dilate more quickly. °What happens in the transitional phase? °The transitional phase typically lasts from 30 minutes to 2 hours. At the end of this phase, your cervix will be fully dilated to 10 cm. During the transitional phase: °· Contractions will get stronger and longer. °· Contractions may last 60-90 seconds and come less than 2 minutes apart. °· You may feel hot flashes, chills, or nausea. °How does this affect my baby? °During the first stage of labor, your baby will   gradually move down into your birth canal. °Follow these instructions at home and in the hospital or birth center: ° °· When labor first begins, try to stay calm. You are still in the early phase. If it is night, try to get some sleep. If it is day, try to relax and save your energy. You may want to make some calls and get ready to go to the hospital or birth center. °· When you are in the early phase, try these methods to help ease discomfort: °? Deep breathing and  muscle relaxation. °? Taking a walk. °? Taking a warm bath or shower. °· Drink some fluids and have a light snack if you feel like it. °· Keep track of your contractions. °· Based on the plan you created with your birth care provider, call when your contractions indicate it is time. °· If your water breaks, note the time, color, and odor of the fluid. °· When you are in the active phase, do your breathing exercises and rely on your support people and your team of birth care providers. °Contact a health care provider if: °· Your contractions are strong and regular. °· You have lower back pain or cramping. °· Your water breaks. °· You lose your mucus plug. °Get help right away if you: °· Have a severe headache that does not go away. °· Have changes in your vision. °· Have severe pain in your upper belly. °· Do not feel the baby move. °· Have bright red bleeding. °Summary °· The first stage of labor starts when true labor begins, and it ends when your cervix is dilated to 10 cm. °· The first stage of labor has three phases: early, active, and transitional. °· Your baby moves into the birth canal during the first stage of labor. °· You may have contractions that become stronger and longer. You may also lose your mucus plug and have your water break. °· Call your birth care provider when your contractions are frequent and strong enough to go to the hospital or birth center. °This information is not intended to replace advice given to you by your health care provider. Make sure you discuss any questions you have with your health care provider. °Document Revised: 03/20/2019 Document Reviewed: 02/10/2018 °Elsevier Patient Education © 2020 [employer] ° °

## 2020-06-28 NOTE — MAU Note (Signed)
I have communicated with Clayton Bibles, CNM and reviewed vital signs:  Vitals:   06/28/20 0352 06/28/20 0355  BP:  127/79  Pulse:  77  Resp: 18   Temp:    SpO2:  97%    Vaginal exam:  Dilation: 2 Effacement (%): 80, 90 Cervical Position: Middle Station: -2 Presentation: Vertex Exam by:: Lamont Snowball, RN,   Also reviewed contraction pattern and that non-stress test is reactive.  It has been documented that patient is contracting every 2-4 minutes with minimal cervical change over 1.5 hours not indicating active labor.  Patient denies any other complaints.  Based on this report provider has given order for discharge.  A discharge order and diagnosis entered by a provider.   Labor discharge instructions reviewed with patient.

## 2020-06-28 NOTE — MAU Provider Note (Signed)
  S: Ms. Stacey Fox is a 39 y.o. G1P0 at [redacted]w[redacted]d  who presents to MAU today complaining contractions q three minutes . She endorses bloody show. She denies LOF. She reports normal fetal movement.    O: BP 113/76 (BP Location: Right Arm)   Pulse 70   Temp 98.5 F (36.9 C) (Oral)   Resp 19   Wt 85.9 kg   BMI 33.53 kg/m  GENERAL: Well-developed, well-nourished female in no acute distress.  HEAD: Normocephalic, atraumatic.  CHEST: Normal effort of breathing, regular heart rate ABDOMEN: Soft, nontender, gravid  Cervical exam:  Dilation: 2 Effacement (%): 80, 90 Cervical Position: Middle Station: -2 Presentation: Vertex Exam by:: Lamont Snowball, RN   Fetal Monitoring: Baseline: 125 Variability: Mod Accelerations: 15 x 15 Decelerations: N/A Contractions: Irregular q 2-5   A: SIUP at [redacted]w[redacted]d  Negligible cervical change from 1.5 to 2 cm in 90 minutes Possibly early labor Rx Ambien per patient request    Meds ordered this encounter  Medications  . zolpidem (AMBIEN) 5 MG tablet    Sig: Take 1 tablet (5 mg total) by mouth at bedtime as needed for sleep.    Dispense:  6 tablet    Refill:  0    Order Specific Question:   Supervising Provider    Answer:   Lazaro Arms [[id number]]   P: Discharge home in stable conditon  Clayton Bibles, [place] 06/28/2020 4:05 AM

## 2020-06-28 NOTE — MAU Note (Signed)
Pt here today with c/o leaking fuild.  That when she was discharged earlier today and when she went to the car where she had leaking of brownish fluid.  She called her office and was advised to come to MAU.

## 2020-06-28 NOTE — Anesthesia Procedure Notes (Addendum)
Epidural ?Patient location during procedure: OB ? ?Staffing ?Anesthesiologist: Mathew Postiglione E, MD ?Performed: anesthesiologist  ? ?Preanesthetic Checklist ?Completed: patient identified, IV checked, risks and benefits discussed, monitors and equipment checked, pre-op evaluation and timeout performed ? ?Epidural ?Patient position: sitting ?Prep: DuraPrep ?Patient monitoring: heart rate, continuous pulse ox and blood pressure ?Approach: midline ?Location: L3-L4 ?Injection technique: LOR air ? ?Needle:  ?Needle type: Tuohy  ?Needle gauge: 17 G ?Needle length: 9 cm ?Needle insertion depth: 5 cm ?Catheter type: closed end flexible ?Catheter size: 19 Gauge ?Catheter at skin depth: 10 cm ?Test dose: negative ? ?Assessment ?Events: blood not aspirated, injection not painful, no injection resistance, no paresthesia and negative IV test ? ?Additional Notes ?Reason for block:procedure for pain ? ? ? ?

## 2020-06-28 NOTE — Progress Notes (Signed)
Stacey Fox is a 39 y.o. G1P0 at [redacted]w[redacted]d by ultrasound admitted for early labor and vaginal bleeding vs heavy bloody show  Subjective: S/p epidural at admission and resting. No further bleeding, only brown dc   Objective: BP 122/73   Pulse 74   Temp 98.5 F (36.9 C) (Oral)   Resp 18   Ht 5\' 3"  (1.6 m)   Wt 85.9 kg   SpO2 100%   BMI 33.53 kg/m   FHT:  FHR: 135 bpm, variability: moderate,  accelerations:  Present,  decelerations:  Absent UC:   regular, every 3 minutes SVE:   Dilation: 4 Effacement (%): 90 Station: -3 Dr -clear fluid slow trickle on exam. Controlled AROM of forebag done for larger opening in the bag with fundal pressure in place. Clear copious fluid, brown bloody discharge. Head presented in cervix and well applied after AROM Positioned in sitting up chair position to aid descent   Labs: Lab Results  Component Value Date   WBC 10.9 (H) 06/28/2020   HGB 12.9 06/28/2020   HCT 39.8 06/28/2020   MCV 91.5 06/28/2020   PLT 191 06/28/2020    Assessment / Plan: Spontaneous labor, progressing normally, SROM now, f/by  AROM or forebag   Fetal Wellbeing:  Category I Pain Control:  Epidural I/D:  GBS+, Ancef 2 gm q8g hrs, tolerating well  Anticipated MOD:  working towards vaginal delivery. Pelvis feels adquate but closely monitor descent. EFW 8.1/2 - 9 lbs   06/30/2020 06/28/2020, 9:45 PM

## 2020-06-29 ENCOUNTER — Encounter (HOSPITAL_COMMUNITY): Payer: Self-pay | Admitting: Obstetrics & Gynecology

## 2020-06-29 ENCOUNTER — Encounter (HOSPITAL_COMMUNITY)
Admission: AD | Disposition: A | Discharge status: Home / Self Care | Payer: Self-pay | Source: Home / Self Care | Attending: Obstetrics & Gynecology

## 2020-06-29 DIAGNOSIS — O99892 Other specified diseases and conditions complicating childbirth: Secondary | ICD-10-CM | POA: Diagnosis present

## 2020-06-29 LAB — RPR: RPR Ser Ql: NONREACTIVE

## 2020-06-29 SURGERY — Surgical Case
Anesthesia: Epidural | Site: Abdomen | Wound class: Clean Contaminated

## 2020-06-29 MED ORDER — PANTOPRAZOLE SODIUM 20 MG PO TBEC
20.0000 mg | DELAYED_RELEASE_TABLET | Freq: Every day | ORAL | Status: DC
  Administered 2020-06-30 – 2020-07-01 (×2): 20 mg via ORAL
  Filled 2020-06-29 (×2): qty 1

## 2020-06-29 MED ORDER — HYDROMORPHONE HCL 1 MG/ML IJ SOLN: 0.2500 mg | INTRAMUSCULAR | Status: DC | PRN

## 2020-06-29 MED ORDER — SODIUM BICARBONATE 8.4 % IV SOLN
INTRAVENOUS | Status: DC | PRN
  Administered 2020-06-29: 10 mL via EPIDURAL

## 2020-06-29 MED ORDER — OXYCODONE HCL 5 MG PO TABS
5.0000 mg | ORAL_TABLET | ORAL | Status: DC | PRN
  Filled 2020-06-29: qty 1

## 2020-06-29 MED ORDER — CEFAZOLIN SODIUM-DEXTROSE 2-4 GM/100ML-% IV SOLN: 2.0000 g | INTRAVENOUS | Status: DC

## 2020-06-29 MED ORDER — CEFAZOLIN SODIUM-DEXTROSE 2-3 GM-%(50ML) IV SOLR
INTRAVENOUS | Status: DC | PRN
  Administered 2020-06-29: 2 g via INTRAVENOUS

## 2020-06-29 MED ORDER — DIBUCAINE (PERIANAL) 1 % EX OINT: 1.0000 "application " | TOPICAL_OINTMENT | CUTANEOUS | Status: DC | PRN

## 2020-06-29 MED ORDER — ONDANSETRON HCL 4 MG/2ML IJ SOLN
INTRAMUSCULAR | Status: DC | PRN
  Administered 2020-06-29: 4 mg via INTRAVENOUS

## 2020-06-29 MED ORDER — MEPERIDINE HCL 25 MG/ML IJ SOLN: 6.2500 mg | INTRAMUSCULAR | Status: DC | PRN

## 2020-06-29 MED ORDER — CHLOROPROCAINE HCL (PF) 3 % IJ SOLN
INTRAMUSCULAR | Status: DC | PRN
  Administered 2020-06-29: 20 mL

## 2020-06-29 MED ORDER — COCONUT OIL OIL: 1.0000 "application " | TOPICAL_OIL | Status: DC | PRN

## 2020-06-29 MED ORDER — LACTATED RINGERS IV SOLN: INTRAVENOUS | Status: DC

## 2020-06-29 MED ORDER — SIMETHICONE 80 MG PO CHEW
80.0000 mg | CHEWABLE_TABLET | Freq: Three times a day (TID) | ORAL | Status: DC
  Administered 2020-06-29 – 2020-07-01 (×5): 80 mg via ORAL
  Filled 2020-06-29 (×5): qty 1

## 2020-06-29 MED ORDER — PRENATAL MULTIVITAMIN CH
1.0000 | ORAL_TABLET | Freq: Every day | ORAL | Status: DC
  Administered 2020-06-30 – 2020-07-01 (×2): 1 via ORAL
  Filled 2020-06-29 (×2): qty 1

## 2020-06-29 MED ORDER — WITCH HAZEL-GLYCERIN EX PADS: 1.0000 "application " | MEDICATED_PAD | CUTANEOUS | Status: DC | PRN

## 2020-06-29 MED ORDER — ACETAMINOPHEN 325 MG PO TABS: 650.0000 mg | ORAL_TABLET | ORAL | Status: DC | PRN

## 2020-06-29 MED ORDER — STERILE WATER FOR IRRIGATION IR SOLN
Status: DC | PRN
  Administered 2020-06-29: 1000 mL

## 2020-06-29 MED ORDER — MORPHINE SULFATE (PF) 10 MG/ML IV SOLN
INTRAVENOUS | Status: DC | PRN
  Administered 2020-06-29: 3 mg via EPIDURAL

## 2020-06-29 MED ORDER — OXYTOCIN-SODIUM CHLORIDE 30-0.9 UT/500ML-% IV SOLN
INTRAVENOUS | Status: AC
  Filled 2020-06-29: qty 1000

## 2020-06-29 MED ORDER — MORPHINE SULFATE (PF) 0.5 MG/ML IJ SOLN
INTRAMUSCULAR | Status: AC
  Filled 2020-06-29: qty 10

## 2020-06-29 MED ORDER — KETOROLAC TROMETHAMINE 30 MG/ML IJ SOLN
INTRAMUSCULAR | Status: AC
  Filled 2020-06-29: qty 1

## 2020-06-29 MED ORDER — SODIUM BICARBONATE 8.4 % IV SOLN
INTRAVENOUS | Status: AC
  Filled 2020-06-29: qty 50

## 2020-06-29 MED ORDER — IBUPROFEN 800 MG PO TABS
800.0000 mg | ORAL_TABLET | Freq: Four times a day (QID) | ORAL | Status: DC
  Administered 2020-06-30 – 2020-07-01 (×4): 800 mg via ORAL
  Filled 2020-06-29 (×4): qty 1

## 2020-06-29 MED ORDER — ONDANSETRON HCL 4 MG/2ML IJ SOLN
INTRAMUSCULAR | Status: AC
  Filled 2020-06-29: qty 2

## 2020-06-29 MED ORDER — CHLOROPROCAINE HCL (PF) 3 % IJ SOLN
INTRAMUSCULAR | Status: AC
  Filled 2020-06-29: qty 20

## 2020-06-29 MED ORDER — LIDOCAINE HCL (PF) 2 % IJ SOLN
INTRAMUSCULAR | Status: AC
  Filled 2020-06-29: qty 20

## 2020-06-29 MED ORDER — DIPHENHYDRAMINE HCL 25 MG PO CAPS: 25.0000 mg | ORAL_CAPSULE | Freq: Four times a day (QID) | ORAL | Status: DC | PRN

## 2020-06-29 MED ORDER — SENNOSIDES-DOCUSATE SODIUM 8.6-50 MG PO TABS
2.0000 | ORAL_TABLET | ORAL | Status: DC
  Administered 2020-06-29 – 2020-06-30 (×2): 2 via ORAL
  Filled 2020-06-29 (×2): qty 2

## 2020-06-29 MED ORDER — FENTANYL CITRATE (PF) 100 MCG/2ML IJ SOLN
INTRAMUSCULAR | Status: DC | PRN
  Administered 2020-06-29 (×2): 50 ug via EPIDURAL

## 2020-06-29 MED ORDER — SODIUM CHLORIDE 0.9 % IR SOLN
Status: DC | PRN
  Administered 2020-06-29: 1000 mL

## 2020-06-29 MED ORDER — FENTANYL CITRATE (PF) 100 MCG/2ML IJ SOLN
INTRAMUSCULAR | Status: AC
  Filled 2020-06-29: qty 2

## 2020-06-29 MED ORDER — EPINEPHRINE PF 1 MG/ML IJ SOLN
INTRAMUSCULAR | Status: AC
  Filled 2020-06-29: qty 1

## 2020-06-29 MED ORDER — MENTHOL 3 MG MT LOZG: 1.0000 | LOZENGE | OROMUCOSAL | Status: DC | PRN

## 2020-06-29 MED ORDER — OXYTOCIN-SODIUM CHLORIDE 30-0.9 UT/500ML-% IV SOLN
1.0000 m[IU]/min | INTRAVENOUS | Status: DC
  Administered 2020-06-29: 1 m[IU]/min via INTRAVENOUS

## 2020-06-29 MED ORDER — LACTATED RINGERS IV SOLN: INTRAVENOUS | Status: DC | PRN

## 2020-06-29 MED ORDER — POVIDONE-IODINE 10 % EX SWAB: 2.0000 "application " | Freq: Once | CUTANEOUS | Status: DC

## 2020-06-29 MED ORDER — OXYTOCIN-SODIUM CHLORIDE 30-0.9 UT/500ML-% IV SOLN
INTRAVENOUS | Status: DC | PRN
  Administered 2020-06-29: 41.7 mL/h via INTRAVENOUS
  Administered 2020-06-29: 500 mL via INTRAVENOUS

## 2020-06-29 MED ORDER — METOCLOPRAMIDE HCL 5 MG/ML IJ SOLN
INTRAMUSCULAR | Status: AC
  Filled 2020-06-29: qty 2

## 2020-06-29 MED ORDER — TETANUS-DIPHTH-ACELL PERTUSSIS 5-2.5-18.5 LF-MCG/0.5 IM SUSP: 0.5000 mL | Freq: Once | INTRAMUSCULAR | Status: DC

## 2020-06-29 MED ORDER — DEXAMETHASONE SODIUM PHOSPHATE 4 MG/ML IJ SOLN
INTRAMUSCULAR | Status: AC
  Filled 2020-06-29: qty 1

## 2020-06-29 MED ORDER — OXYTOCIN-SODIUM CHLORIDE 30-0.9 UT/500ML-% IV SOLN: 1.0000 m[IU]/min | INTRAVENOUS | Status: DC

## 2020-06-29 MED ORDER — SIMETHICONE 80 MG PO CHEW
80.0000 mg | CHEWABLE_TABLET | ORAL | Status: DC | PRN
  Administered 2020-06-30: 80 mg via ORAL
  Filled 2020-06-29: qty 1

## 2020-06-29 MED ORDER — DEXAMETHASONE SODIUM PHOSPHATE 4 MG/ML IJ SOLN
INTRAMUSCULAR | Status: DC | PRN
  Administered 2020-06-29: 4 mg via INTRAVENOUS

## 2020-06-29 MED ORDER — TERBUTALINE SULFATE 1 MG/ML IJ SOLN: 0.2500 mg | Freq: Once | INTRAMUSCULAR | Status: DC | PRN

## 2020-06-29 MED ORDER — KETOROLAC TROMETHAMINE 30 MG/ML IJ SOLN
30.0000 mg | Freq: Once | INTRAMUSCULAR | Status: AC | PRN
  Administered 2020-06-29: 30 mg via INTRAVENOUS

## 2020-06-29 MED ORDER — OXYTOCIN-SODIUM CHLORIDE 30-0.9 UT/500ML-% IV SOLN
2.5000 [IU]/h | INTRAVENOUS | Status: AC
  Administered 2020-06-29: 2.5 [IU]/h via INTRAVENOUS

## 2020-06-29 MED ORDER — PROMETHAZINE HCL 25 MG/ML IJ SOLN: 6.2500 mg | INTRAMUSCULAR | Status: DC | PRN

## 2020-06-29 MED ORDER — ZOLPIDEM TARTRATE 5 MG PO TABS: 5.0000 mg | ORAL_TABLET | Freq: Every evening | ORAL | Status: DC | PRN

## 2020-06-29 MED ORDER — KETOROLAC TROMETHAMINE 30 MG/ML IJ SOLN
30.0000 mg | Freq: Four times a day (QID) | INTRAMUSCULAR | Status: AC
  Administered 2020-06-29 – 2020-06-30 (×4): 30 mg via INTRAVENOUS
  Filled 2020-06-29 (×4): qty 1

## 2020-06-29 MED ORDER — SIMETHICONE 80 MG PO CHEW
80.0000 mg | CHEWABLE_TABLET | ORAL | Status: DC
  Administered 2020-06-29 – 2020-06-30 (×2): 80 mg via ORAL
  Filled 2020-06-29 (×2): qty 1

## 2020-06-29 MED ORDER — SCOPOLAMINE 1 MG/3DAYS TD PT72
MEDICATED_PATCH | TRANSDERMAL | Status: AC
  Filled 2020-06-29: qty 1

## 2020-06-29 SURGICAL SUPPLY — 35 items
BENZOIN TINCTURE PRP APPL 2/3 (GAUZE/BANDAGES/DRESSINGS) ×2 IMPLANT
CHLORAPREP W/TINT 26ML (MISCELLANEOUS) ×2 IMPLANT
CLAMP CORD UMBIL (MISCELLANEOUS) IMPLANT
CLOSURE STERI STRIP 1/2 X4 (GAUZE/BANDAGES/DRESSINGS) ×2 IMPLANT
CLOTH BEACON ORANGE TIMEOUT ST (SAFETY) ×2 IMPLANT
DRSG OPSITE POSTOP 4X10 (GAUZE/BANDAGES/DRESSINGS) ×2 IMPLANT
ELECT REM PT RETURN 9FT ADLT (ELECTROSURGICAL) ×2
ELECTRODE REM PT RTRN 9FT ADLT (ELECTROSURGICAL) ×1 IMPLANT
EXTRACTOR VACUUM KIWI (MISCELLANEOUS) IMPLANT
EXTRACTOR VACUUM M CUP 4 TUBE (SUCTIONS) IMPLANT
GLOVE BIO SURGEON STRL SZ7 (GLOVE) ×2 IMPLANT
GLOVE BIOGEL PI IND STRL 7.0 (GLOVE) ×2 IMPLANT
GLOVE BIOGEL PI INDICATOR 7.0 (GLOVE) ×2
GOWN STRL REUS W/TWL LRG LVL3 (GOWN DISPOSABLE) ×4 IMPLANT
KIT ABG SYR 3ML LUER SLIP (SYRINGE) IMPLANT
NEEDLE HYPO 25X5/8 SAFETYGLIDE (NEEDLE) IMPLANT
NS IRRIG 1000ML POUR BTL (IV SOLUTION) ×2 IMPLANT
PACK C SECTION WH (CUSTOM PROCEDURE TRAY) ×2 IMPLANT
PAD OB MATERNITY 4.3X12.25 (PERSONAL CARE ITEMS) ×2 IMPLANT
RTRCTR C-SECT PINK 25CM LRG (MISCELLANEOUS) IMPLANT
STRIP CLOSURE SKIN 1/2X4 (GAUZE/BANDAGES/DRESSINGS) IMPLANT
SUT MNCRL 0 VIOLET CTX 36 (SUTURE) ×3 IMPLANT
SUT MONOCRYL 0 CTX 36 (SUTURE) ×3
SUT PLAIN 0 NONE (SUTURE) IMPLANT
SUT PLAIN 2 0 (SUTURE)
SUT PLAIN ABS 2-0 CT1 27XMFL (SUTURE) IMPLANT
SUT VIC AB 0 CT1 27 (SUTURE) ×2
SUT VIC AB 0 CT1 27XBRD ANBCTR (SUTURE) ×2 IMPLANT
SUT VIC AB 2-0 CT1 27 (SUTURE) ×1
SUT VIC AB 2-0 CT1 TAPERPNT 27 (SUTURE) ×1 IMPLANT
SUT VIC AB 4-0 KS 27 (SUTURE) ×2 IMPLANT
SUT VICRYL 0 TIES 12 18 (SUTURE) IMPLANT
TOWEL OR 17X24 6PK STRL BLUE (TOWEL DISPOSABLE) ×2 IMPLANT
TRAY FOLEY W/BAG SLVR 14FR LF (SET/KITS/TRAYS/PACK) IMPLANT
WATER STERILE IRR 1000ML POUR (IV SOLUTION) ×2 IMPLANT

## 2020-06-29 NOTE — Progress Notes (Signed)
Pt assessed again. C/o LUQ pain with UCs  BP (!) 101/51   Pulse 84   Temp 99.5 F (37.5 C) (Oral)   Resp 16   Ht 5\' 3"  (1.6 m)   Wt 85.9 kg   SpO2 100%   BMI 33.53 kg/m   FHT 140s mod variab, + accels, no decels- cat I  toco q 3  min, MVUs adequate  SVE 5-6/ thick swollen cervix with caput but stn -3 Protracted labor, arrest of dilatation and no descent Proceed with C-section, pt agrees.   Risks/complications of surgery reviewed incl infection, bleeding, damage to internal organs including bladder, bowels, ureters, blood vessels, other risks from anesthesia, VTE and delayed complications of any surgery, complications in future surgery reviewed. Also discussed neonatal complications incl difficult delivery, laceration, vacuum assistance, TTN etc. Pt understands and agrees, all concerns addressed.     V.Marquett Bertoli, MD

## 2020-06-29 NOTE — Progress Notes (Signed)
Pt assessed again. C/o LUQ pain with UCs  BP 120/72   Pulse 81   Temp 99.7 F (37.6 C) (Oral)   Resp 18   Ht 5\' 3"  (1.6 m)   Wt 85.9 kg   SpO2 100%   BMI 33.53 kg/m   FHT 140s mod variab, + accels, no decels- cat I  toco now q 5 min, so RN started pitocin 2x2  SVE 5-6/ thick swollen cervix with caput but stn -3 Protracted labor, pitocin started, IUPC placed and will reassess cervix once adequate. Likely CPD. Discussed findings and concerns and possible C-section, agrees.  GBS+, Ancef 2 gm q8hrs  V. , MD

## 2020-06-29 NOTE — Progress Notes (Addendum)
Stacey Fox is a 39 y.o. G1P0 at [redacted]w[redacted]d by 1st trim sono, admitted for early labor with pain and heavier bloody show, progressed to 4 cm with SROM f/by AROM of forebag. Overnight good UCs pattern but spacing out to q 4 min this morning. EFW 8.1/2- 9 lbs. Overnight protracted labor with 4 cm at 9.45 pm to 6.5 cm at 6.15 am. UCs are q 4 min FHT 140s mod variability, + accels, no decels. Vital signs wnl, BP 105/65   Pulse 83   Temp 99.4 F (37.4 C) (Oral)   Resp 20   Ht 5\' 3"  (1.6 m)   Wt 85.9 kg   SpO2 100%   BMI 33.53 kg/m , pain none since epidural   GBS+, Ancef 2 gm q8 hrs. S/p 2 doses.  FHT- cat I  Protracted labor, continue to assess, at risk for C/section, LGA.   06/29/2020, 6:36 AM

## 2020-06-29 NOTE — Transfer of Care (Signed)
Immediate Anesthesia Transfer of Care Note  Patient: Stacey Fox  Procedure(s) Performed: CESAREAN SECTION (N/A Abdomen)  Patient Location: PACU  Anesthesia Type:Epidural  Level of Consciousness: awake, alert  and oriented  Airway & Oxygen Therapy: Patient Spontanous Breathing  Post-op Assessment: Report given to RN and Post -op Vital signs reviewed and stable  Post vital signs: Reviewed and stable  Last Vitals:  Vitals Value Taken Time  BP 109/70 06/29/20 1138  Temp    Pulse 88 06/29/20 1140  Resp 19 06/29/20 1140  SpO2 99 % 06/29/20 1140  Vitals shown include unvalidated device data.  Last Pain:  Vitals:   06/29/20 0930  TempSrc: Oral  PainSc:          Complications: No complications documented.

## 2020-06-29 NOTE — Op Note (Signed)
Cesarean Section Procedure Note  Stacey Fox 06/29/2020  Procedure: Primary Low Transverse Cesarean section                       (two layer closure)   Indications: Arrest of dilatation, no fetal descent, cephalopelvic disproportion    Pre-operative Diagnosis: Primary Low Transverse Cesarean section for arrest of dilation and descent.   Post-operative Diagnosis: Same   Surgeon:  Shea Evans, MD  Assistants: Kathlyn Sacramento   Anesthesia: epidural   Procedure Details:  The patient was seen in the Labor Room with arrest of cervical dilatation. Cesarean delivery was advised. The risks, benefits, complications, treatment options, and expected outcomes were discussed with the patient. The patient concurred with the proposed plan, giving informed consent. identified as [name] and the procedure verified as C-Section Delivery.  She was brought to the OR. A Time Out was held and the above information confirmed. 2 gm Ancef given.  After induction of anesthesia, the patient was draped and prepped in the usual sterile manner, foley was draining urine well but dark slight blood tinged urine.  A pfannenstiel incision was made and carried down through the subcutaneous tissue to the fascia. Fascial incision was made and extended transversely. The fascia was separated from the underlying rectus tissue superiorly and inferiorly. The peritoneum was identified and entered. Peritoneal incision was extended longitudinally. Alexis-O retractor placed. The utero-vesical peritoneal reflection was incised transversely and the bladder flap was bluntly freed from the lower uterine segment. A low transverse uterine incision was made. Face was noted with occiput in PO position. Delivered from cephalic presentation by flexion and rotation was a FEMALE infant with vigorous cry. Apgar scores of 9 at one minute and 9 at five minutes. Delayed cord clamping done at 1 minute and baby handed to NICU team in attendance. Cord  ph was not sent. Cord blood was obtained for evaluation. The placenta was removed Intact and appeared normal. The uterine outline, tubes and ovaries appeared normal. The uterine incision was closed with running locked sutures of 0 Monocryl.. A second imbricating layer sutured.   Hemostasis was observed. Alexis retractor removed. Peritoneal closure done with 2-0 Vicryl.  The fascia was then reapproximated with running sutures of 0Vicryl. The subcuticular closure was performed using 2-0plain gut. The skin was closed with 4-0Vicryl.   Instrument, sponge, and needle counts were correct prior the abdominal closure and were correct at the conclusion of the case.   Findings: Boy delivered at 10.37 am via [facility] hysterotomy that was closed in 2 layers. Clear amniotic fluid. No extensions. Baby in direct OP undecsended position.    Estimated Blood Loss: 650 cc   Total IV Fluids: 3500 ml LR   Urine Output: 350CC OF clear urine  Specimens: cord blood   Complications: none  Disposition: PACU - hemodynamically stable.   Maternal Condition: stable   Baby condition / location:  Couplet care / Skin to Skin  Attending Attestation: I performed the procedure.   Signed: Surgeon(s): Shea Evans, MD

## 2020-06-29 NOTE — Lactation Note (Signed)
This note was copied from a baby's chart. Lactation Consultation Note  Patient Name: Stacey Fox [id number] Date: 06/29/2020  [name] Stacey Fox now 6 hours old born via csection at 38 weeks and 3 days gestation. Dad holding [name] Fox on arrival.  Mom reports they took a virtual breastfeeding class.  Mom reports she already has her own personal DEBP.  Mom reports got a Medela freestyle DEBP. Mom reports they were told to feed on cue and no more than 4 hours.  Discussed that is is recommended to feed on cue but that baby needs 8 or more feedings in 24 hours.  Explained to mom that if she goes 4 hours without feeding baby may not be able to get in 8 or more minimal recommended feedings a day. Also discussed that sometimes if babies are really sleepy it is hard to get recommended number of feeds in 24 hours.  Mom reports she has been leaking colostrum for a while.  Mom has not hand expressed but someone hand expressed for her and able to remove some drops.  Discussed importance of learning hand expression.  Discussed hand expression and spoon feeding.  Mom reports she will go back to work in three months.  Mom reports she plans to breastfeed for 6 months or longer if able.  Reviewed and left [facility] breastfeeding Consultation Services Handout.  Urged parents to call lactation as needed.  Holli Rengel S Aliah Eriksson 06/29/2020, 5:50 PM

## 2020-06-30 LAB — CBC
HCT: 26.1 % — ABNORMAL LOW (ref 36.0–46.0)
Hemoglobin: 8.4 g/dL — ABNORMAL LOW (ref 12.0–15.0)
MCH: 30.1 pg (ref 26.0–34.0)
MCHC: 32.2 g/dL (ref 30.0–36.0)
MCV: 93.5 fL (ref 80.0–100.0)
Platelets: 153 10*3/uL (ref 150–400)
RBC: 2.79 MIL/uL — ABNORMAL LOW (ref 3.87–5.11)
RDW: 14.5 % (ref 11.5–15.5)
WBC: 12.7 10*3/uL — ABNORMAL HIGH (ref 4.0–10.5)
nRBC: 0 % (ref 0.0–0.2)

## 2020-06-30 MED ORDER — HYDROCHLOROTHIAZIDE 12.5 MG PO CAPS
12.5000 mg | ORAL_CAPSULE | Freq: Two times a day (BID) | ORAL | Status: DC
  Administered 2020-06-30 – 2020-07-01 (×2): 12.5 mg via ORAL
  Filled 2020-06-30 (×2): qty 1

## 2020-06-30 MED ORDER — SODIUM CHLORIDE 0.9 % IV SOLN
510.0000 mg | Freq: Once | INTRAVENOUS | Status: AC
  Administered 2020-06-30: 510 mg via INTRAVENOUS
  Filled 2020-06-30: qty 17

## 2020-06-30 NOTE — Progress Notes (Signed)
POD 1  Patient notes pain overall well controlled but notes skin feels tight.  Positive flatus, no bowel movement, no emesis and tolerating regular p.o.  Concerned that she looks more pregnant now than prior to delivery  Vitals reviewed.  Labs reviewed.  Physical exam: Abdomen with soft distention and tympany Lower extremity 3+ edema extending up to the thighs  Assessment and plan: Awaiting return of bowel function.  Okay to continue regular food as long as no nausea.  Edema, symptomatic.  Known anemia.  IV iron given today.  We will plan small doses of hydrochlorothiazide for 48 hours.  Keep watch on blood pressure is running low.  Lendon Colonel 06/30/2020 4:15 PM

## 2020-06-30 NOTE — Anesthesia Postprocedure Evaluation (Signed)
Anesthesia Post Note  Patient: Stacey Fox  Procedure(s) Performed: CESAREAN SECTION (N/A Abdomen)     Patient location during evaluation: PACU Anesthesia Type: Epidural Level of consciousness: awake Pain management: pain level controlled Vital Signs Assessment: post-procedure vital signs reviewed and stable Respiratory status: spontaneous breathing Cardiovascular status: stable Postop Assessment: no headache, no backache, epidural receding, patient able to bend at knees and no apparent nausea or vomiting Anesthetic complications: no   No complications documented.  Last Vitals:  Vitals:   06/30/20 0522 06/30/20 0846  BP: 107/70 96/81  Pulse: 69 67  Resp: 16 16  Temp: 36.9 C 36.5 C  SpO2: 100% 100%    Last Pain:  Vitals:   06/30/20 1115  TempSrc:   PainSc: 0-No pain   Pain Goal:                   Caren Macadam

## 2020-06-30 NOTE — Lactation Note (Signed)
This note was copied from a baby's chart. Lactation Consultation Note  Patient Name: Boy Cassady Stanczak RDEYC'X Date: 06/30/2020  baby boy Milkovich with 4 percent weight loss and adequate voids and poops for his first day of life. Mom reports that sometimes he latches well but sometimes he doesn't latch well and she feels like he is in a good position to latch.  Mom reports breasts and nipples without soreness except mom reports bruised herself when doing hand expression. Mom requested manual pump.  Gave mom manual pump and demo how to use.  Discussed possibly prepumping to help him latch. Parents deny need for lactation services at this time.  Praised breastfeeding.  Reviewed cluster feeding and baby's second night.  Urged to call lactation as needed.   Maternal Data    Feeding    LATCH Score                   Interventions    Lactation Tools Discussed/Used     Consult Status      Shaolin Armas Michaelle Copas 06/30/2020, 5:38 PM

## 2020-06-30 NOTE — Progress Notes (Signed)
POSTOPERATIVE DAY # 1 S/P [facility] for CPD, arrest of dilatation and descent, baby boy "Stacey Fox"   S:         Reports feeling okay, tired and sore             Tolerating po intake / no nausea / no vomiting / (+) flatus / no BM  Denies dizziness, SOB, or CP             Bleeding is light             Pain controlled withToradol             Up ad lib / ambulatory/ voiding QS without difficulty  Newborn breast feeding  / Circumcision - planning circ prior to d/c   O:  VS: BP 96/81 (BP Location: Right Arm)   Pulse 67   Temp 97.7 F (36.5 C) (Oral)   Resp 16   Ht 5\' 3"  (1.6 m)   Wt 85.9 kg   SpO2 100%   Breastfeeding Unknown   BMI 33.53 kg/m   Patient Vitals for the past 24 hrs:  BP Temp Temp src Pulse Resp SpO2  06/30/20 0846 96/81 97.7 F (36.5 C) Oral 67 16 100 %  06/30/20 0522 107/70 98.5 F (36.9 C) Oral 69 16 100 %  06/30/20 0157 99/61 98.5 F (36.9 C) Oral 74 18 99 %  06/29/20 2157 106/73 98.3 F (36.8 C) Oral 71 16 99 %  06/29/20 1801 -- 98.6 F (37 C) Oral -- 16 98 %  06/29/20 1400 109/70 98.9 F (37.2 C) Oral 68 18 97 %  06/29/20 1249 109/64 98.8 F (37.1 C) Oral 60 18 97 %     LABS:               Recent Labs    06/28/20 1517 06/30/20 0550  WBC 10.9* 12.7*  HGB 12.9 8.4*  PLT 191 153               Bloodtype: --/--/O POS Performed at [facility], [address], [place], [place]  [phone])  Rubella: Immune (01/06 0000)                                             I&O: Intake/Output      07/20 0701 - 07/21 0700 07/21 0701 - 07/22 0700   P.O. 1710    I.V. (mL/kg) 6633.5 (77.2) 593.5 (6.9)   Other 0    IV Piggyback 100    Total Intake(mL/kg) 8443.5 (98.3) 593.5 (6.9)   Urine (mL/kg/hr) 1700 (0.8) 500 (1.1)   Blood 650    Total Output 2350 500   Net +6093.5 +93.5                     Physical Exam:             Alert and Oriented X3  Lungs: Clear and unlabored  Heart: regular rate and rhythm / no murmurs  Abdomen: soft,  non-tender, moderate gaseous distention, active bowel sounds              Fundus: firm, non-tender, U-1             Dressing: honeycomb with steri-strips c/d/i              Incision:  approximated with  sutures / no erythema / no ecchymosis / no drainage  Perineum: intact  Lochia: small rubra on pad   Extremities: +1 LE edema, no calf pain or tenderness  A/P:    POD # 1 S/P [facility]            ABL anemia   - discussed r/b of IV iron and patient agrees   - Plan for Feraheme x 1 dose now   - Then Niferex 150mg  PO and Mag ox 400mg  PO daily  GERD   - on Protonix   Routine postoperative care              Lactation support PRN  Ambulation and warm liquids to promote bowel motility   Circ prior to d/c  Pt. Interested in early d/c home tomorrow   , MSN, CNM [facility]

## 2020-07-01 DIAGNOSIS — D62 Acute posthemorrhagic anemia: Secondary | ICD-10-CM | POA: Diagnosis not present

## 2020-07-01 MED ORDER — OXYCODONE HCL 5 MG PO TABS: 5.0000 mg | ORAL_TABLET | ORAL | 0 refills | Status: DC | PRN

## 2020-07-01 MED ORDER — IBUPROFEN 800 MG PO TABS: 800.0000 mg | ORAL_TABLET | Freq: Four times a day (QID) | ORAL | 0 refills | Status: DC

## 2020-07-01 MED ORDER — POLYSACCHARIDE IRON COMPLEX 150 MG PO CAPS
150.0000 mg | ORAL_CAPSULE | Freq: Every day | ORAL | Status: DC
  Administered 2020-07-01: 150 mg via ORAL
  Filled 2020-07-01: qty 1

## 2020-07-01 MED ORDER — ACETAMINOPHEN 500 MG PO TABS
1000.0000 mg | ORAL_TABLET | Freq: Four times a day (QID) | ORAL | 2 refills | Status: DC | PRN
Start: 2020-07-01 — End: 2020-10-28

## 2020-07-01 MED ORDER — HYDROCHLOROTHIAZIDE 12.5 MG PO CAPS: 12.5000 mg | ORAL_CAPSULE | Freq: Two times a day (BID) | ORAL | 0 refills | Status: DC

## 2020-07-01 MED ORDER — POLYSACCHARIDE IRON COMPLEX 150 MG PO CAPS: 150.0000 mg | ORAL_CAPSULE | Freq: Every day | ORAL | Status: DC

## 2020-07-01 MED ORDER — MAGNESIUM OXIDE -MG SUPPLEMENT 400 (240 MG) MG PO TABS
400.0000 mg | ORAL_TABLET | Freq: Every day | ORAL | Status: DC
Start: 2020-07-01 — End: 2022-02-16

## 2020-07-01 MED ORDER — MAGNESIUM OXIDE 400 (241.3 MG) MG PO TABS
400.0000 mg | ORAL_TABLET | Freq: Every day | ORAL | Status: DC
  Administered 2020-07-01: 400 mg via ORAL
  Filled 2020-07-01: qty 1

## 2020-07-01 MED ORDER — SIMETHICONE 80 MG PO CHEW: 80.0000 mg | CHEWABLE_TABLET | ORAL | 0 refills | Status: DC | PRN

## 2020-07-01 NOTE — Lactation Note (Signed)
This note was copied from a baby's chart. Lactation Consultation Note  Patient Name: Stacey Fox [id number] Date: 07/01/2020 Reason for consult: Follow-up assessment   P1, Baby 50 hours old.  Baby breastfeeding well.  Mother latched baby in football hold.  Noted and heard swallows. Answered questions about frequency and cluster feeding. Infant's nipples are green. Feed on demand with cues.  Goal 8-12+ times per day after first 24 hrs.  Place baby STS if not cueing.  Reviewed engorgement care and monitoring voids/stools. Encouraged support group.     Maternal Data Has patient been taught Hand Expression?: Yes  Feeding Feeding Type: Breast Fed  LATCH Score Latch: Grasps breast easily, tongue down, lips flanged, rhythmical sucking.  Audible Swallowing: Spontaneous and intermittent  Type of Nipple: Everted at rest and after stimulation  Comfort (Breast/Nipple): Soft / non-tender  Hold (Positioning): No assistance needed to correctly position infant at breast.  LATCH Score: 10  Interventions Interventions: Breast feeding basics reviewed  Lactation Tools Discussed/Used     Consult Status Consult Status: Complete Date: 07/01/20    Dahlia Byes [facility] 07/01/2020, 12:45 PM

## 2020-07-01 NOTE — Discharge Instructions (Signed)
Lactation outpatient support - home visit ° ° °Linda Coppola °RN, MHA, IBCLC °at [facility]: Lactation Consultant ° °[email or web address] ° °

## 2020-07-01 NOTE — Discharge Summary (Signed)
OB Discharge Summary  Patient Name: Stacey Fox DOB: 02/14/1981 MRN: [id number]  Date of admission: 06/28/2020 Delivering provider: MODY, VAISHALI   Admitting diagnosis: Vaginal bleeding in pregnancy, third trimester [O46.93] Delivery by emergency cesarean [O99.892] Intrauterine pregnancy: [redacted]w[redacted]d     Secondary diagnosis: Patient Active Problem List   Diagnosis Date Noted  . Acute blood loss anemia 07/01/2020  . Postpartum care following cesarean delivery 7/20 06/30/2020  . Delivery by emergency cesarean 06/29/2020   Additional problems:dependent edema   Date of discharge: 07/01/2020   Discharge diagnosis: Principal Problem:   Postpartum care following cesarean delivery 7/20 Active Problems:   Delivery by emergency cesarean   Acute blood loss anemia                                                              Post partum procedures:IV Feraheme  Augmentation: AROM and Pitocin Pain control: Epidural  Complications: None  Hospital course:  Onset of Labor With Unplanned C/S   39 y.o. yo G2P1011 at [redacted]w[redacted]d was admitted in Latent Labor on 06/28/2020. Patient had a labor course significant for protracted labor augmented with Pitocin. The patient went for cesarean section due to Arrest of Dilation. Delivery details as follows: Membrane Rupture Time/Date: 9:40 PM ,06/28/2020   Delivery Method:C-Section, Low Transverse  Details of operation can be found in separate operative note. Patient had an uncomplicated postpartum course.  She is ambulating,tolerating a regular diet, passing flatus, and urinating well.  Patient is discharged home in stable condition 07/01/20.  Newborn Data: Birth date:06/29/2020  Birth time:10:37 AM  Gender:Female  Living status:Living  Apgars:9 ,9  Weight:3890 g   Physical exam  Vitals:   06/30/20 0522 06/30/20 0846 06/30/20 1933 07/01/20 0559  BP: 107/70 96/81 112/68 109/74  Pulse: 69 67 76 66  Resp: 16 16 17 16   Temp: 98.5 F (36.9 C) 97.7 F (36.5 C) 98  F (36.7 C) 98.2 F (36.8 C)  TempSrc: Oral Oral Oral Oral  SpO2: 100% 100% 99% 99%  Weight:      Height:       General: alert, cooperative and no distress Lochia: appropriate Uterine Fundus: firm Incision: Healing well with no significant drainage, Dressing is clean, dry, and intact DVT Evaluation: No cords or calf tenderness. Calf/Ankle edema is present Labs: Lab Results  Component Value Date   WBC 12.7 (H) 06/30/2020   HGB 8.4 (L) 06/30/2020   HCT 26.1 (L) 06/30/2020   MCV 93.5 06/30/2020   PLT 153 06/30/2020   No flowsheet data found. Edinburgh Postnatal Depression Scale Screening Tool 06/29/2020  I have been able to laugh and see the funny side of things. 0  I have looked forward with enjoyment to things. 0  I have blamed myself unnecessarily when things went wrong. 0  I have been anxious or worried for no good reason. 0  I have felt scared or panicky for no good reason. 0  Things have been getting on top of me. 0  I have been so unhappy that I have had difficulty sleeping. 0  I have felt sad or miserable. 0  I have been so unhappy that I have been crying. 0  The thought of harming myself has occurred to me. 0  Edinburgh Postnatal Depression Scale Total 0  Discharge instruction:  per After Visit Summary,  [facility] booklet and  "Understanding Mother & Baby Care" hospital booklet  After Visit Meds:  Allergies as of 07/01/2020      Reactions   Penicillins Rash      Medication List    STOP taking these medications   ferrous sulfate 325 (65 FE) MG tablet   folic acid 1 MG tablet Commonly known as: FOLVITE   zolpidem 5 MG tablet Commonly known as: AMBIEN     TAKE these medications   acetaminophen 500 MG tablet Commonly known as: TYLENOL Take 2 tablets (1,000 mg total) by mouth every 6 (six) hours as needed.   docusate sodium 100 MG capsule Commonly known as: COLACE Take 100 mg by mouth daily.   hydrochlorothiazide 12.5 MG capsule Commonly known  as: MICROZIDE Take 1 capsule (12.5 mg total) by mouth 2 (two) times daily for 2 doses.   ibuprofen 800 MG tablet Commonly known as: ADVIL Take 1 tablet (800 mg total) by mouth every 6 (six) hours.   iron polysaccharides 150 MG capsule Commonly known as: Ferrex 150 Take 1 capsule (150 mg total) by mouth daily.   Magnesium Oxide 400 (240 Mg) MG Tabs Take 1 tablet (400 mg total) by mouth daily. For prevention of constipation.   multivitamin-prenatal 27-0.8 MG Tabs tablet Take 1 tablet by mouth daily at 12 noon.   oxyCODONE 5 MG immediate release tablet Commonly known as: Oxy IR/ROXICODONE Take 1-2 tablets (5-10 mg total) by mouth every 4 (four) hours as needed for moderate pain.   pantoprazole 20 MG tablet Commonly known as: PROTONIX Take 20 mg by mouth daily.   polyvinyl alcohol 1.4 % ophthalmic solution Commonly known as: LIQUIFILM TEARS Place 1 drop into both eyes as needed for dry eyes.   PROBIOTIC PO Take 2 capsules by mouth daily.   senna 8.6 MG Tabs tablet Commonly known as: SENOKOT Take 1-2 tablets by mouth daily. 1 tablet then 2 tablets then 1 tablet then 2 tablets alternating days   simethicone 80 MG chewable tablet Commonly known as: MYLICON Chew 1 tablet (80 mg total) by mouth as needed for flatulence.   sodium chloride 0.65 % Soln nasal spray Commonly known as: OCEAN Place 1 spray into both nostrils as needed for congestion.       Diet: routine diet  Activity: Advance as tolerated. Pelvic rest for 6 weeks.   Postpartum contraception: Not Discussed  Newborn Data: Live born female  Birth Weight: 8 lb 9.2 oz (3890 g) APGAR: 9, 9  Newborn Delivery   Birth date/time: 06/29/2020 10:37:00 Delivery type: C-Section, Low Transverse Trial of labor: Yes C-section categorization: Primary    Baby Feeding: Breast Disposition:home with mother   Delivery Report:  Review the Delivery Report for details.    Follow up:  Follow-up Information    Noland Fordyce, MD. Schedule an appointment as soon as possible for a visit in 6 week(s).   Specialty: Obstetrics and Gynecology Contact information: [address] [place] [address] [phone]                 Signed: Cipriano Mile, MSN 07/01/2020, 11:38 AM

## 2020-07-13 ENCOUNTER — Telehealth (HOSPITAL_COMMUNITY): Payer: Self-pay | Admitting: Lactation Services

## 2020-07-13 NOTE — Telephone Encounter (Signed)
Outgoing call - LC called this mom in regards to her concerns newborn spitty,  Per mom last evening having challenges with engorgement and now resolved. Baby still spitty at times. LC reassured mom that some spitting is normal.  LC recommended prior to latching , burp the baby, and make sure the breast is not to full to start. LC recommended to hand express and or pre-pump with hand  Pump off the fullness so the tissue beyond the nipple is like a thinner sandwich and baby can obtain a deeper latch.  If the baby feeds the 1st breast and releases , breast still full and baby is hungry , re- latch in a different position with depth so baby will benefit from the creamy fatty milk. And pump the other breast down to comfort.  Per mom is using two different positions - football and cross cradle.  And last night was to full one breast and prior to latching - pumped off 3 oz and then latched. Baby has been having greater than 5-6 wet diapers and greater than 2-3 yellow stools. 1st doctor visit baby had gained weight and tomorrow will be 2 week check up and LC appt at [facility] center in [place] .  LC encouraged mom to keep the appt for reassurance.  LC praised mom for her breast feeding efforts.

## 2020-09-21 ENCOUNTER — Other Ambulatory Visit: Payer: Self-pay

## 2020-09-21 ENCOUNTER — Ambulatory Visit: Payer: Self-pay

## 2020-09-21 ENCOUNTER — Ambulatory Visit (INDEPENDENT_AMBULATORY_CARE_PROVIDER_SITE_OTHER): Payer: BC Managed Care – PPO | Admitting: Family Medicine

## 2020-09-21 ENCOUNTER — Encounter: Payer: Self-pay | Admitting: Family Medicine

## 2020-09-21 VITALS — BP 118/84 | HR 68 | Ht 63.0 in | Wt 156.0 lb

## 2020-09-21 DIAGNOSIS — G8929 Other chronic pain: Secondary | ICD-10-CM | POA: Diagnosis not present

## 2020-09-21 DIAGNOSIS — M65311 Trigger thumb, right thumb: Secondary | ICD-10-CM | POA: Diagnosis not present

## 2020-09-21 DIAGNOSIS — M79644 Pain in right finger(s): Secondary | ICD-10-CM | POA: Diagnosis not present

## 2020-09-21 NOTE — Patient Instructions (Signed)
Heat and massage  Vitamin D 4000-5000IU daily Frog splint at night See me in 5 weeks for injection if needed

## 2020-09-21 NOTE — Assessment & Plan Note (Signed)
Patient actually has significant amount of calcium in the tendon.  Discussed different treatment options.  Patient has elected to try the massage, vitamin D supplementation as well as bracing at night.  Patient will try this follow-up with me again in 5 to 6 weeks.  Worsening symptoms will consider injection.

## 2020-09-21 NOTE — Progress Notes (Signed)
[name] [address] [place] [address] Phone: [phone] Subjective:   Bruce Donath, am serving as a scribe for Dr. Antoine Primas. This visit occurred during the SARS-CoV-2 public health emergency.  Safety protocols were in place, including screening questions prior to the visit, additional usage of staff PPE, and extensive cleaning of exam room while observing appropriate contact time as indicated for disinfecting solutions.   I'm seeing this patient by the request  of:  Patient, No Pcp Per  CC: right hand pain   [id number]  Stacey Fox is a 39 y.o. female coming in with complaint of right thumb pain. Patient states that she has had pain for 5 months. Feels a catching in thumb with grasping. Also feels catching at night.  Does not remember any true injury. Patient states that it hurts significantly in the morning.     Past Medical History:  Diagnosis Date  . Asthma    exercise induced   . Migraines    Past Surgical History:  Procedure Laterality Date  . CESAREAN SECTION N/A 06/29/2020   Procedure: CESAREAN SECTION;  Surgeon: Shea Evans, MD;  Location: [facility] LD ORS;  Service: Obstetrics;  Laterality: N/A;  . MOUTH SURGERY     Social History   Socioeconomic History  . Marital status: Divorced    Spouse name: Not on file  . Number of children: Not on file  . Years of education: Not on file  . Highest education level: Not on file  Occupational History  . Not on file  Tobacco Use  . Smoking status: Never Smoker  . Smokeless tobacco: Never Used  Vaping Use  . Vaping Use: Never used  Substance and Sexual Activity  . Alcohol use: Not Currently  . Drug use: Not Currently  . Sexual activity: Yes  Other Topics Concern  . Not on file  Social History Narrative  . Not on file   Social Determinants of Health   Financial Resource Strain:   . Difficulty of Paying Living Expenses: Not on file  Food Insecurity:   . Worried  About Programme researcher, broadcasting/film/video in the Last Year: Not on file  . Ran Out of Food in the Last Year: Not on file  Transportation Needs:   . Lack of Transportation (Medical): Not on file  . Lack of Transportation (Non-Medical): Not on file  Physical Activity:   . Days of Exercise per Week: Not on file  . Minutes of Exercise per Session: Not on file  Stress:   . Feeling of Stress : Not on file  Social Connections:   . Frequency of Communication with Friends and Family: Not on file  . Frequency of Social Gatherings with Friends and Family: Not on file  . Attends Religious Services: Not on file  . Active Member of Clubs or Organizations: Not on file  . Attends Banker Meetings: Not on file  . Marital Status: Not on file   Allergies  Allergen Reactions  . Penicillins Rash   Family History  Problem Relation Age of Onset  . Hypertension Father   . Heart disease Father   . Diabetes Paternal Grandmother   . Hypertension Paternal Grandmother   . Hypertension Maternal Grandfather   . Cancer Maternal Grandfather   . Cancer Paternal Grandfather        Current Outpatient Medications (Analgesics):  .  acetaminophen (TYLENOL) 500 MG tablet, Take 2 tablets (1,000 mg total) by mouth every  6 (six) hours as needed.  Current Outpatient Medications (Hematological):  .  iron polysaccharides (FERREX 150) 150 MG capsule, Take 1 capsule (150 mg total) by mouth daily.  Current Outpatient Medications (Other):  .  docusate sodium (COLACE) 100 MG capsule, Take 100 mg by mouth daily. .  Magnesium Oxide 400 (240 Mg) MG TABS, Take 1 tablet (400 mg total) by mouth daily. For prevention of constipation. .  polyvinyl alcohol (LIQUIFILM TEARS) 1.4 % ophthalmic solution, Place 1 drop into both eyes as needed for dry eyes. .  Prenatal Vit-Fe Fumarate-FA (MULTIVITAMIN-PRENATAL) 27-0.8 MG TABS tablet, Take 1 tablet by mouth daily at 12 noon. .  Probiotic Product (PROBIOTIC PO), Take 2 capsules by mouth  daily.   Reviewed prior external information including notes and imaging from  primary care provider As well as notes that were available from care everywhere and other healthcare systems.  Past medical history, social, surgical and family history all reviewed in electronic medical record.  No pertanent information unless stated regarding to the chief complaint.   Review of Systems:  No headache, visual changes, nausea, vomiting, diarrhea, constipation, dizziness, abdominal pain, skin rash, fevers, chills, night sweats, weight loss, swollen lymph nodes, body aches, joint swelling, chest pain, shortness of breath, mood changes. POSITIVE muscle aches  Objective  Blood pressure 118/84, pulse 68, height 5\' 3"  (1.6 m), weight 156 lb (70.8 kg), SpO2 98 %, unknown if currently breastfeeding.   General: No apparent distress alert and oriented x3 mood and affect normal, dressed appropriately.  HEENT: Pupils equal, extraocular movements intact  Respiratory: Patient's speak in full sentences and does not appear short of breath  Cardiovascular: No lower extremity edema, non tender, no erythema  Neuro: Cranial nerves II through XII are intact, neurovascularly intact in all extremities with 2+ DTRs and 2+ pulses.  Gait normal with good balance and coordination.  MSK:  Non tender with full range of motion and good stability and symmetric strength and tone of shoulders, elbows, wrist, hip, knee and ankles bilaterally.  Patient's right thumb does have a trigger nodule and seems to be tender to palpation over the A1 pulley.  Triggering noted even today a little bit.  Neurovascular intact distally.  Limited musculoskeletal ultrasound was performed and interpreted by  Limited ultrasound of patient's thumb and the flexor tendon sheath shows the patient does have calcific changes noted at the A1 pulley.  Seems to be more like a calcific trigger nodule.  Freely movable. Impression: Calcific  trigger nodule   Impression and Recommendations:     The above documentation has been reviewed and is accurate and complete Judi Saa, DO

## 2020-10-28 ENCOUNTER — Encounter: Payer: Self-pay | Admitting: Family Medicine

## 2020-10-28 ENCOUNTER — Ambulatory Visit (INDEPENDENT_AMBULATORY_CARE_PROVIDER_SITE_OTHER): Payer: BC Managed Care – PPO | Admitting: Family Medicine

## 2020-10-28 ENCOUNTER — Other Ambulatory Visit: Payer: Self-pay

## 2020-10-28 ENCOUNTER — Ambulatory Visit: Payer: Self-pay

## 2020-10-28 VITALS — BP 108/74 | HR 81 | Ht 63.0 in | Wt 156.0 lb

## 2020-10-28 DIAGNOSIS — G8929 Other chronic pain: Secondary | ICD-10-CM

## 2020-10-28 DIAGNOSIS — M65311 Trigger thumb, right thumb: Secondary | ICD-10-CM | POA: Diagnosis not present

## 2020-10-28 DIAGNOSIS — M79644 Pain in right finger(s): Secondary | ICD-10-CM | POA: Diagnosis not present

## 2020-10-28 NOTE — Assessment & Plan Note (Signed)
Patient responded well to the injection.  Discussed icing regimen and home exercise, which activities to doing which wants to avoid.  Increase activity slowly.  Patient will continue to wear the brace at night.  If continuing to have trouble imaging will be necessary but I think will be highly unlikely.  We decided on the injection with patient still breast-feeding but this would likely be the safer option.

## 2020-10-28 NOTE — Progress Notes (Signed)
[name] [address] [place] [address] Phone: [phone] Subjective:   Bruce Donath, am serving as a scribe for Dr. Antoine Primas. This visit occurred during the SARS-CoV-2 public health emergency.  Safety protocols were in place, including screening questions prior to the visit, additional usage of staff PPE, and extensive cleaning of exam room while observing appropriate contact time as indicated for disinfecting solutions.   I'm seeing this patient by the request  of:  Patient, No Pcp Per  CC:   [id number]   09/21/2020 Patient actually has significant amount of calcium in the tendon.  Discussed different treatment options.  Patient has elected to try the massage, vitamin D supplementation as well as bracing at night.  Patient will try this follow-up with me again in 5 to 6 weeks.  Worsening symptoms will consider injection.  Update 10/28/2020 Stacey Fox is a 39 y.o. female coming in with complaint of right thumb pain. Patient states that her thumb is getting worse. Locking daily now. Is using a brace to help with locking.       Past Medical History:  Diagnosis Date  . Asthma    exercise induced   . Migraines    Past Surgical History:  Procedure Laterality Date  . CESAREAN SECTION N/A 06/29/2020   Procedure: CESAREAN SECTION;  Surgeon: Shea Evans, MD;  Location: [facility];  Service: Obstetrics;  Laterality: N/A;  . MOUTH SURGERY     Social History   Socioeconomic History  . Marital status: Divorced    Spouse name: Not on file  . Number of children: Not on file  . Years of education: Not on file  . Highest education level: Not on file  Occupational History  . Not on file  Tobacco Use  . Smoking status: Never Smoker  . Smokeless tobacco: Never Used  Vaping Use  . Vaping Use: Never used  Substance and Sexual Activity  . Alcohol use: Not Currently  . Drug use: Not Currently  . Sexual activity: Yes  Other  Topics Concern  . Not on file  Social History Narrative  . Not on file   Social Determinants of Health   Financial Resource Strain:   . Difficulty of Paying Living Expenses: Not on file  Food Insecurity:   . Worried About Programme researcher, broadcasting/film/video in the Last Year: Not on file  . Ran Out of Food in the Last Year: Not on file  Transportation Needs:   . Lack of Transportation (Medical): Not on file  . Lack of Transportation (Non-Medical): Not on file  Physical Activity:   . Days of Exercise per Week: Not on file  . Minutes of Exercise per Session: Not on file  Stress:   . Feeling of Stress : Not on file  Social Connections:   . Frequency of Communication with Friends and Family: Not on file  . Frequency of Social Gatherings with Friends and Family: Not on file  . Attends Religious Services: Not on file  . Active Member of Clubs or Organizations: Not on file  . Attends Banker Meetings: Not on file  . Marital Status: Not on file   Allergies  Allergen Reactions  . Penicillins Rash   Family History  Problem Relation Age of Onset  . Hypertension Father   . Heart disease Father   . Diabetes Paternal Grandmother   . Hypertension Paternal Grandmother   . Hypertension Maternal Grandfather   .  Cancer Maternal Grandfather   . Cancer Paternal Grandfather        Current Outpatient Medications (Analgesics):  .  acetaminophen (TYLENOL) 500 MG tablet, Take 2 tablets (1,000 mg total) by mouth every 6 (six) hours as needed.  Current Outpatient Medications (Hematological):  .  iron polysaccharides (FERREX 150) 150 MG capsule, Take 1 capsule (150 mg total) by mouth daily.  Current Outpatient Medications (Other):  .  docusate sodium (COLACE) 100 MG capsule, Take 100 mg by mouth daily. .  Magnesium Oxide 400 (240 Mg) MG TABS, Take 1 tablet (400 mg total) by mouth daily. For prevention of constipation. .  polyvinyl alcohol (LIQUIFILM TEARS) 1.4 % ophthalmic solution, Place 1  drop into both eyes as needed for dry eyes. .  Prenatal Vit-Fe Fumarate-FA (MULTIVITAMIN-PRENATAL) 27-0.8 MG TABS tablet, Take 1 tablet by mouth daily at 12 noon. .  Probiotic Product (PROBIOTIC PO), Take 2 capsules by mouth daily.   Reviewed prior external information including notes and imaging from  primary care provider As well as notes that were available from care everywhere and other healthcare systems.  Past medical history, social, surgical and family history all reviewed in electronic medical record.  No pertanent information unless stated regarding to the chief complaint.   Review of Systems:  No headache, visual changes, nausea, vomiting, diarrhea, constipation, dizziness, abdominal pain, skin rash, fevers, chills, night sweats, weight loss, swollen lymph nodes, body aches, joint swelling, chest pain, shortness of breath, mood changes. POSITIVE muscle aches  Objective  Blood pressure 108/74, pulse 81, height 5\' 3"  (1.6 m), weight 156 lb (70.8 kg), SpO2 98 %, unknown if currently breastfeeding.   General: No apparent distress alert and oriented x3 mood and affect normal, dressed appropriately.  HEENT: Pupils equal, extraocular movements intact  Respiratory: Patient's speak in full sentences and does not appear short of breath  Cardiovascular: No lower extremity edema, non tender, no erythema  N right thumb exam shows the patient does have a trigger nodule at the A2 pulley.  Tender to palpation in this area.  Triggering noted fairly severe.  Procedure: Real-time Ultrasound Guided Injection of flexor tendon sheath Device: GE Logiq Q7 Ultrasound guided injection is preferred based studies that show increased duration, increased effect, greater accuracy, decreased procedural pain, increased response rate, and decreased cost with ultrasound guided versus blind injection.  Verbal informed consent obtained.  Time-out conducted.  Noted no overlying erythema, induration, or other signs  of local infection.  Skin prepped in a sterile fashion.  Local anesthesia: Topical Ethyl chloride.  With sterile technique and under real time ultrasound guidance: With a 25-gauge half inch needle injected with 0.5 cc of 0.5% Marcaine and 0.5 cc of Kenalog 40 mg/mL into the tendon sheath Completed without difficulty  Pain immediately resolved suggesting accurate placement of the medication.  Advised to call if fevers/chills, erythema, induration, drainage, or persistent bleeding.  Impression: Technically successful ultrasound guided injection.    Impression and Recommendations:     The above documentation has been reviewed and is accurate and complete , DO

## 2020-10-28 NOTE — Patient Instructions (Addendum)
Tried injection today See me again in 4-5 weeks

## 2020-12-08 NOTE — Progress Notes (Signed)
[name] [address] [place] [address] Phone: [phone] Subjective:   Stacey Fox, am serving as a scribe for Dr. Antoine Primas. This visit occurred during the SARS-CoV-2 public health emergency.  Safety protocols were in place, including screening questions prior to the visit, additional usage of staff PPE, and extensive cleaning of exam room while observing appropriate contact time as indicated for disinfecting solutions.   I'm seeing this patient by the request  of:  Patient, No Pcp Per  CC: Right thumb pain follow-up  [id number]   10/28/2020 Patient responded well to the injection.  Discussed icing regimen and home exercise, which activities to doing which wants to avoid.  Increase activity slowly.  Patient will continue to wear the brace at night.  If continuing to have trouble imaging will be necessary but I think will be highly unlikely.  We decided on the injection with patient still breast-feeding but this would likely be the safer option.  Update 12/08/2020 Stacey Fox is a 39 y.o. female coming in with complaint of right hand trigger finger. Two weeks ago the right thumb began triggering again.  Patient states for 4 weeks seem to be doing significantly better.  Last 2 weeks started noticing more triggering and starting to have increasing discomfort and pain again with work.  Still better than what it was prior to the injection but feels like it is worsening by the day.      Past Medical History:  Diagnosis Date  . Asthma    exercise induced   . Migraines    Past Surgical History:  Procedure Laterality Date  . CESAREAN SECTION N/A 06/29/2020   Procedure: CESAREAN SECTION;  Surgeon: Shea Evans, MD;  Location: [facility];  Service: Obstetrics;  Laterality: N/A;  . MOUTH SURGERY     Social History   Socioeconomic History  . Marital status: Divorced    Spouse name: Not on file  . Number of children: Not on file  .  Years of education: Not on file  . Highest education level: Not on file  Occupational History  . Not on file  Tobacco Use  . Smoking status: Never Smoker  . Smokeless tobacco: Never Used  Vaping Use  . Vaping Use: Never used  Substance and Sexual Activity  . Alcohol use: Not Currently  . Drug use: Not Currently  . Sexual activity: Yes  Other Topics Concern  . Not on file  Social History Narrative  . Not on file   Social Determinants of Health   Financial Resource Strain: Not on file  Food Insecurity: Not on file  Transportation Needs: Not on file  Physical Activity: Not on file  Stress: Not on file  Social Connections: Not on file   Allergies  Allergen Reactions  . Penicillins Rash   Family History  Problem Relation Age of Onset  . Hypertension Father   . Heart disease Father   . Diabetes Paternal Grandmother   . Hypertension Paternal Grandmother   . Hypertension Maternal Grandfather   . Cancer Maternal Grandfather   . Cancer Paternal Grandfather         Current Outpatient Medications (Hematological):  .  iron polysaccharides (FERREX 150) 150 MG capsule, Take 1 capsule (150 mg total) by mouth daily.  Current Outpatient Medications (Other):  .  docusate sodium (COLACE) 100 MG capsule, Take 100 mg by mouth daily. .  Magnesium Oxide 400 (240 Mg) MG TABS, Take 1 tablet (  400 mg total) by mouth daily. For prevention of constipation. .  polyvinyl alcohol (LIQUIFILM TEARS) 1.4 % ophthalmic solution, Place 1 drop into both eyes as needed for dry eyes. .  Prenatal Vit-Fe Fumarate-FA (MULTIVITAMIN-PRENATAL) 27-0.8 MG TABS tablet, Take 1 tablet by mouth daily at 12 noon. .  Probiotic Product (PROBIOTIC PO), Take 2 capsules by mouth daily.   Reviewed prior external information including notes and imaging from  primary care provider As well as notes that were available from care everywhere and other healthcare systems.  Past medical history, social, surgical and family  history all reviewed in electronic medical record.  No pertanent information unless stated regarding to the chief complaint.   Review of Systems:  No headache, visual changes, nausea, vomiting, diarrhea, constipation, dizziness, abdominal pain, skin rash, fevers, chills, night sweats, weight loss, swollen lymph nodes, body aches, joint swelling, chest pain, shortness of breath, mood changes. POSITIVE muscle aches  Objective  Blood pressure 100/68, pulse 80, height 5\' 3"  (1.6 m), weight 155 lb (70.3 kg), SpO2 98 %, unknown if currently breastfeeding.   General: No apparent distress alert and oriented x3 mood and affect normal, dressed appropriately.  HEENT: Pupils equal, extraocular movements intact  Respiratory: Patient's speak in full sentences and does not appear short of breath  Cardiovascular: No lower extremity edema, non tender, no erythema  Neuro: Cranial nerves II through XII are intact, neurovascularly intact in all extremities with 2+ DTRs and 2+ pulses.  Gait normal with good balance and coordination.  MSK: Right hand exam shows that patient does have some trigger nodule noted again.  Does seem to be smaller.  Does have some triggering noted mostly at the A2 pulley.  Minor tenderness to palpation which is improved from previous exam.  Good grip strength  Limited musculoskeletal ultrasound was performed and interpreted by  Limited ultrasound shows the patient does have some trigger nodule.  Again with some hypoechoic changes within the tendon sheath.  Patient does have what appeared to be increased Doppler flow but when compared to the contralateral side unremarkable.  No masses appreciated. Impression: Recurrent trigger nodule of the right thumb    Impression and Recommendations:     The above documentation has been reviewed and is accurate and complete Judi Saa, DO

## 2020-12-09 ENCOUNTER — Ambulatory Visit (INDEPENDENT_AMBULATORY_CARE_PROVIDER_SITE_OTHER): Payer: BC Managed Care – PPO | Admitting: Family Medicine

## 2020-12-09 ENCOUNTER — Ambulatory Visit: Payer: Self-pay

## 2020-12-09 ENCOUNTER — Other Ambulatory Visit: Payer: Self-pay

## 2020-12-09 ENCOUNTER — Encounter: Payer: Self-pay | Admitting: Family Medicine

## 2020-12-09 VITALS — BP 100/68 | HR 80 | Ht 63.0 in | Wt 155.0 lb

## 2020-12-09 DIAGNOSIS — M65311 Trigger thumb, right thumb: Secondary | ICD-10-CM | POA: Diagnosis not present

## 2020-12-09 NOTE — Patient Instructions (Signed)
[facility]-Dr. Janee Morn Make appt in 6 weeks in case you want to do another injection Keep using brace otherwise Happy New Year!

## 2020-12-09 NOTE — Assessment & Plan Note (Signed)
Patient had injection and responded initially but now already 6 weeks out has having the recurrent pain again.  Patient is having recurrent triggering.  Will wear the brace.  Will refer to hand surgery secondary to patient's young age and not wanting to do serial injections possible.  We did discuss the potential of PRP.  Patient will follow up with me again in 6 weeks if she did want another injection but I encouraged her to truly consider the possibility of surgery

## 2021-01-19 ENCOUNTER — Ambulatory Visit: Payer: BC Managed Care – PPO | Admitting: Family Medicine

## 2022-02-16 ENCOUNTER — Emergency Department
Admission: EM | Admit: 2022-02-16 | Discharge: 2022-02-16 | Disposition: A | Discharge status: Home / Self Care | Payer: 59 | Source: Home / Self Care

## 2022-02-16 ENCOUNTER — Other Ambulatory Visit: Payer: Self-pay

## 2022-02-16 DIAGNOSIS — R42 Dizziness and giddiness: Secondary | ICD-10-CM

## 2022-02-16 DIAGNOSIS — I499 Cardiac arrhythmia, unspecified: Secondary | ICD-10-CM | POA: Diagnosis not present

## 2022-02-16 NOTE — Discharge Instructions (Signed)
Check my chart for test results ?Continue to limit caffeine ?See PCP in follow up ?I will place referral to cardiology ?

## 2022-02-16 NOTE — ED Triage Notes (Signed)
Pt presents with intermittent palpitations, chest pain, and sensation of shaking for "several weeks". Pt states today she has experienced dizziness and fullness in her throat. ?

## 2022-02-16 NOTE — ED Provider Notes (Signed)
?[facility] ? ? ? ?CSN: [id number] ?Arrival date & time: 02/16/22  1704 ? ? ?  ? ?History   ?Chief Complaint ?Chief Complaint  ?Patient presents with  ? Palpitations  ? ? ?HPI ?Stacey Fox is a 41 y.o. female.  ? ?HPI ? ?Patient is healthy and on no medicines ?She tries to eat well and sleep well ?Has a 20 month child ?Limits caffeine ?Exercises regularly ?Has noted for months irregular heartbeats ?Feels like a pause and a thump.  Mostly at night.  No other symptoms initially ?Perhaps increased stress with work and new baby ?She has increasing symptoms, today was the worst.  Lightheaded ness with heart irregularities, a sense of fullness in throat, feeling of trembling in hands ?No risk factors for CVD- HTN, lipids, smoking, DM, family history ? ? ?Past Medical History:  ?Diagnosis Date  ? Asthma   ? exercise induced   ? Migraines   ? ? ?Patient Active Problem List  ? Diagnosis Date Noted  ? Trigger thumb of right hand 09/21/2020  ? Acute blood loss anemia 07/01/2020  ? Postpartum care following cesarean delivery 7/20 06/30/2020  ? Delivery by emergency cesarean 06/29/2020  ? ? ?Past Surgical History:  ?Procedure Laterality Date  ? CESAREAN SECTION N/A 06/29/2020  ? Procedure: CESAREAN SECTION;  Surgeon: Shea Evans, MD;  Location: [facility];  Service: Obstetrics;  Laterality: N/A;  ? MOUTH SURGERY    ? ? ?OB History   ? ? Gravida  ?2  ? Para  ?1  ? Term  ?1  ? Preterm  ?   ? AB  ?1  ? Living  ?1  ?  ? ? SAB  ?1  ? IAB  ?   ? Ectopic  ?   ? Multiple  ?0  ? Live Births  ?1  ?   ?  ?  ? ? ? ?Home Medications   ? ?Prior to Admission medications   ?Medication Sig Start Date End Date Taking? Authorizing Provider  ?Probiotic Product (PROBIOTIC PO) Take 2 capsules by mouth daily.    [provider]  ? ? ?Family History ?Family History  ?Problem Relation Age of Onset  ? Hypertension Father   ? Heart disease Father   ? Diabetes Paternal Grandmother   ? Hypertension Paternal Grandmother   ? Hypertension  Maternal Grandfather   ? Cancer Maternal Grandfather   ? Cancer Paternal Grandfather   ? ? ?Social History ?Social History  ? ?Tobacco Use  ? Smoking status: Never  ? Smokeless tobacco: Never  ?Vaping Use  ? Vaping Use: Never used  ?Substance Use Topics  ? Alcohol use: Not Currently  ? Drug use: Not Currently  ? ? ? ?Allergies   ?Penicillins ? ? ?Review of Systems ?Review of Systems ?See HPI ? ?Physical Exam ?Triage Vital Signs ?ED Triage Vitals  ?Enc Vitals Group  ?   BP 02/16/22 1713 123/85  ?   Pulse Rate 02/16/22 1713 77  ?   Resp 02/16/22 1713 14  ?   Temp 02/16/22 1713 98.7 ?F (37.1 ?C)  ?   Temp Source 02/16/22 1713 Oral  ?   SpO2 02/16/22 1713 99 %  ?   Weight 02/16/22 1715 157 lb (71.2 kg)  ?   Height --   ?   Head Circumference --   ?   Peak Flow --   ?   Pain Score 02/16/22 1715 0  ?   Pain Loc --   ?  Pain Edu? --   ?   Excl. in GC? --   ? ?No data found. ? ?Updated Vital Signs ?BP 123/85 (BP Location: Left Arm)   Pulse 77   Temp 98.7 ?F (37.1 ?C) (Oral)   Resp 14   Wt 71.2 kg   SpO2 99%   BMI 27.81 kg/m?  ? ?   ? ?Physical Exam ?Constitutional:   ?   General: She is not in acute distress. ?   Appearance: She is well-developed and normal weight.  ?HENT:  ?   Head: Normocephalic and atraumatic.  ?   Right Ear: Tympanic membrane and ear canal normal.  ?   Left Ear: Tympanic membrane and ear canal normal.  ?   Nose: Nose normal. No rhinorrhea.  ?   Mouth/Throat:  ?   Pharynx: No posterior oropharyngeal erythema.  ?Eyes:  ?   Conjunctiva/sclera: Conjunctivae normal.  ?   Pupils: Pupils are equal, round, and reactive to light.  ?Cardiovascular:  ?   Rate and Rhythm: Normal rate and regular rhythm.  ?   Heart sounds: Normal heart sounds. No murmur heard. ?Pulmonary:  ?   Effort: Pulmonary effort is normal. No respiratory distress.  ?   Breath sounds: Normal breath sounds.  ?Abdominal:  ?   General: There is no distension.  ?   Palpations: Abdomen is soft.  ?Musculoskeletal:     ?   General: Normal range  of motion.  ?   Cervical back: Normal range of motion.  ?Lymphadenopathy:  ?   Cervical: No cervical adenopathy.  ?Skin: ?   General: Skin is warm and dry.  ?Neurological:  ?   Mental Status: She is alert.  ?Psychiatric:     ?   Mood and Affect: Mood normal.     ?   Behavior: Behavior normal.  ? ? ? ?UC Treatments / Results  ? ? ?Procedures ?ED EKG ? ?Date/Time: 02/16/2022 6:13 PM ?Performed by: Eustace MooreNelson, Leyda Vanderwerf Sue, MD ?Authorized by: Eustace MooreNelson, Leeroy Lovings Sue, MD  ? ?ECG reviewed by ED Physician in the absence of a cardiologist: yes   ?Previous ECG:  ?  Previous ECG:  Unavailable ?Interpretation:  ?  Interpretation: normal   ?Rate:  ?  ECG rate:  77 ?  ECG rate assessment: normal   ?Rhythm:  ?  Rhythm: sinus rhythm   ?QRS:  ?  QRS axis:  Normal ?  QRS conduction: normal   ?ST segments:  ?  ST segments:  Normal ?T waves:  ?  T waves: normal   ?Q waves:  ?  Abnormal Q-waves: not present    ? ?Medications Ordered in UC ?Medications - No data to display ? ?Initial Impression / Assessment and Plan / UC Course  ?I have reviewed the triage vital signs and the nursing notes. ? ?Pertinent labs & imaging results that were available during my care of the patient were reviewed by me and considered in my medical decision making (see chart for details). ? ?  ?Discussed with the patient that her symptoms sound like PVCs.  Were not able to capture on the EKG.  Will do screening labs.  Needs PCP or Cardiology to do holter and possibly echocardiogram ?TO ER if acutely worse or chest pain ? ? ?Final Clinical Impressions(s) / UC Diagnoses  ? ?Final diagnoses:  ?Irregular heart beat  ?Lightheadedness  ? ? ? ?Discharge Instructions   ? ?  ?Check my chart for test results ?Continue to limit caffeine ?See  PCP in follow up ?I will place referral to cardiology ? ? ? ? ?ED Prescriptions   ?None ?  ? ?PDMP not reviewed this encounter. ?  ?Eustace Moore, MD ?02/16/22 1824 ? ?

## 2022-02-17 LAB — CBC WITH DIFFERENTIAL/PLATELET
Absolute Monocytes: 554 cells/uL (ref 200–950)
Basophils Absolute: 49 cells/uL (ref 0–200)
Basophils Relative: 1 %
Eosinophils Absolute: 59 cells/uL (ref 15–500)
Eosinophils Relative: 1.2 %
HCT: 39.4 % (ref 35.0–45.0)
Hemoglobin: 13.2 g/dL (ref 11.7–15.5)
Lymphs Abs: 1352 cells/uL (ref 850–3900)
MCH: 28.8 pg (ref 27.0–33.0)
MCHC: 33.5 g/dL (ref 32.0–36.0)
MCV: 86 fL (ref 80.0–100.0)
MPV: 10.7 fL (ref 7.5–12.5)
Monocytes Relative: 11.3 %
Neutro Abs: 2886 cells/uL (ref 1500–7800)
Neutrophils Relative %: 58.9 %
Platelets: 191 10*3/uL (ref 140–400)
RBC: 4.58 10*6/uL (ref 3.80–5.10)
RDW: 12.7 % (ref 11.0–15.0)
Total Lymphocyte: 27.6 %
WBC: 4.9 10*3/uL (ref 3.8–10.8)

## 2022-02-17 LAB — COMPLETE METABOLIC PANEL WITH GFR
AG Ratio: 1.6 (calc) (ref 1.0–2.5)
ALT: 12 U/L (ref 6–29)
AST: 15 U/L (ref 10–30)
Albumin: 4.3 g/dL (ref 3.6–5.1)
Alkaline phosphatase (APISO): 34 U/L (ref 31–125)
BUN: 12 mg/dL (ref 7–25)
CO2: 25 mmol/L (ref 20–32)
Calcium: 9.3 mg/dL (ref 8.6–10.2)
Chloride: 102 mmol/L (ref 98–110)
Creat: 0.82 mg/dL (ref 0.50–0.97)
Globulin: 2.7 g/dL (calc) (ref 1.9–3.7)
Glucose, Bld: 83 mg/dL (ref 65–99)
Potassium: 4 mmol/L (ref 3.5–5.3)
Sodium: 138 mmol/L (ref 135–146)
Total Bilirubin: 0.3 mg/dL (ref 0.2–1.2)
Total Protein: 7 g/dL (ref 6.1–8.1)
eGFR: 93 mL/min/{1.73_m2} (ref >=60)

## 2022-02-17 LAB — TSH: TSH: 1.9 mIU/L

## 2022-03-03 ENCOUNTER — Ambulatory Visit (INDEPENDENT_AMBULATORY_CARE_PROVIDER_SITE_OTHER): Payer: 59

## 2022-03-03 ENCOUNTER — Encounter: Payer: Self-pay | Admitting: Internal Medicine

## 2022-03-03 ENCOUNTER — Other Ambulatory Visit: Payer: Self-pay

## 2022-03-03 ENCOUNTER — Ambulatory Visit: Payer: 59 | Admitting: Internal Medicine

## 2022-03-03 VITALS — BP 100/62 | HR 77 | Ht 62.0 in | Wt 159.2 lb

## 2022-03-03 DIAGNOSIS — R002 Palpitations: Secondary | ICD-10-CM

## 2022-03-03 NOTE — Progress Notes (Deleted)
?Cardiology Office Note:   ? ?Date:  03/03/2022  ? ?ID:  Stacey Fox, DOB Sep 30, 1981, MRN [id number] ? ?PCP:  Patient, No Pcp Per (Inactive) ?  ?[facility] Providers ?Cardiologist:  Maisie Fus, MD { ?Click to update primary MD,subspecialty MD or APP then REFRESH:1}   ? ?Referring MD: Stacey Moore, MD  ? ?No chief complaint on file. ?Palpitations ? ?History of Present Illness:   ? ?Stacey Fox is a 41 y.o. female with no hx of cardiac disease, asthma, migraines ? ?She went to the urgent care 3/9 for palpitations and LH. Her Ekg was normal. Her blood pressures were normal.  She feels extra beats. She felt heart racing. She can feel jittery and edgy feeling.  Has not happened before. She was not dehydrated.  No smoking. 1 cup of coffee a day. No recent syncopal episodes. Father had CABG. He was in his early 71s. ? ?TSH is normal. ? ?She has no CVD risk factors. ? ?EKG 02/16/2022-NSR ? ?Past Medical History:  ?Diagnosis Date  ? Asthma   ? exercise induced   ? Migraines   ? ? ?Past Surgical History:  ?Procedure Laterality Date  ? CESAREAN SECTION N/A 06/29/2020  ? Procedure: CESAREAN SECTION;  Surgeon: Shea Evans, MD;  Location: [facility];  Service: Obstetrics;  Laterality: N/A;  ? MOUTH SURGERY    ? ? ?Current Medications: ?Current Meds  ?Medication Sig  ? Probiotic Product (PROBIOTIC PO) Take 2 capsules by mouth daily.  ?  ? ?Allergies:   Penicillins  ? ?Social History  ? ?Socioeconomic History  ? Marital status: Divorced  ?  Spouse name: Not on file  ? Number of children: Not on file  ? Years of education: Not on file  ? Highest education level: Not on file  ?Occupational History  ? Not on file  ?Tobacco Use  ? Smoking status: Never  ? Smokeless tobacco: Never  ?Vaping Use  ? Vaping Use: Never used  ?Substance and Sexual Activity  ? Alcohol use: Not Currently  ? Drug use: Not Currently  ? Sexual activity: Yes  ?Other Topics Concern  ? Not on file  ?Social History Narrative  ? Not on file  ? ?Social  Determinants of Health  ? ?Financial Resource Strain: Not on file  ?Food Insecurity: Not on file  ?Transportation Needs: Not on file  ?Physical Activity: Not on file  ?Stress: Not on file  ?Social Connections: Not on file  ?  ? ?Family History: ?The patient's family history includes Cancer in her maternal grandfather and paternal grandfather; Diabetes in her paternal grandmother; Heart disease in her father; Hypertension in her father, maternal grandfather, and paternal grandmother. ? ?ROS:   ?Please see the history of present illness.    ? All other systems reviewed and are negative. ? ?EKGs/Labs/Other Studies Reviewed:   ? ?The following studies were reviewed today: ? ? ?EKG:  EKG is  ordered today.  The ekg ordered today demonstrates  ? ?NSR ? ?Recent Labs: ?02/16/2022: ALT 12; BUN 12; Creat 0.82; Hemoglobin 13.2; Platelets 191; Potassium 4.0; Sodium 138; TSH 1.90  ?Recent Lipid Panel ?No results found for: CHOL, TRIG, HDL, CHOLHDL, VLDL, LDLCALC, LDLDIRECT ? ? ?Risk Assessment/Calculations:   ?  ? ?    ? ?Physical Exam:   ? ?VS:  BP 100/62   Pulse 77   Ht 5\' 2"  (1.575 m)   Wt 159 lb 3.2 oz (72.2 kg)   SpO2 98%  BMI 29.12 kg/m?    ? ?Wt Readings from Last 3 Encounters:  ?03/03/22 159 lb 3.2 oz (72.2 kg)  ?02/16/22 157 lb (71.2 kg)  ?12/09/20 155 lb (70.3 kg)  ?  ? ?GEN:  Well nourished, well developed in no acute distress ?HEENT: Normal ?NECK: No JVD; No carotid bruits ?LYMPHATICS: No lymphadenopathy ?CARDIAC: RRR, no murmurs, rubs, gallops ?RESPIRATORY:  Clear to auscultation without rales, wheezing or rhonchi  ?ABDOMEN: Soft, non-tender, non-distended ?MUSCULOSKELETAL:  No edema; No deformity  ?SKIN: Warm and dry ?NEUROLOGIC:  Alert and oriented x 3 ?PSYCHIATRIC:  Normal affect  ? ?ASSESSMENT:   ? ?Palpitations: Patient notes palpitations at night that have persisted. She is concerned and would like to pursue monitoring. Discussed doing 3 day ziopatch. Her EKG is normal in the ED and here. She does not have  high risk features including syncope c/f arrhythmia , family hx of SCD, or abnormalities on her EKG.  ? ?PLAN:   ? ?In order of problems listed above: ? ?Ziopatch  ?Follow up as needed ? ?   ? ?Medication Adjustments/Labs and Tests Ordered: ?Current medicines are reviewed at length with the patient today.  Concerns regarding medicines are outlined above.  ?Orders Placed This Encounter  ?Procedures  ? LONG TERM MONITOR (3-14 DAYS)  ? EKG 12-Lead  ? ?No orders of the defined types were placed in this encounter. ? ? ?Patient Instructions  ?Medication Instructions:  ?No Changes In Medications at this time.  ?*If you need a refill on your cardiac medications before your next appointment, please call your pharmacy* ? ?Testing/Procedures: ? ?ZIO XT- Long Term Monitor Instructions  ? ?Your physician has requested you wear your ZIO patch monitor___3____days.  ? ?This is a single patch monitor.  [employer] supplies one patch monitor per enrollment.  Additional stickers are not available. ?  ?Please do not apply patch if you will be having a Nuclear Stress Test, Echocardiogram, Cardiac CT, MRI, or Chest Xray during the time frame you would be wearing the monitor. The patch cannot be worn during these tests.  You cannot remove and re-apply the ZIO XT patch monitor. ?  ?Your ZIO patch monitor will be sent USPS Priority mail from [facility] directly to your home address. The monitor may also be mailed to a PO BOX if home delivery is not available.   It may take 3-5 days to receive your monitor after you have been enrolled. ?  ?Once you have received you monitor, please review enclosed instructions.  Your monitor has already been registered assigning a specific monitor serial # to you. ?  ?Applying the monitor  ? ?Shave hair from upper left chest. ?  ?Hold abrader disc by orange tab.  Rub abrader in 40 strokes over left upper chest as indicated in your monitor instructions. ?  ?Clean area with 4 enclosed alcohol pads .  Use  all pads to assure are is cleaned thoroughly.  Let dry.  ? ?Apply patch as indicated in monitor instructions.  Patch will be place under collarbone on left side of chest with arrow pointing upward. ?  ?Rub patch adhesive wings for 2 minutes.Remove white label marked "1".  Remove white label marked "2".  Rub patch adhesive wings for 2 additional minutes. ?  ?While looking in a mirror, press and release button in center of patch.  A small green light will flash 3-4 times .  This will be your only indicator the monitor has been turned on. ?    ?  Do not shower for the first 24 hours.  You may shower after the first 24 hours. ?  ?Press button if you feel a symptom. You will hear a small click.  Record Date, Time and Symptom in the Patient Log Book. ?  ?When you are ready to remove patch, follow instructions on last 2 pages of Patient Log Book.  Stick patch monitor onto last page of Patient Log Book. ?  ?Place [facility].  Use locking tab on box and tape box closed securely.  The [employer] box has [employer] postage on it.  Please place in mailbox as soon as possible.  Your physician should have your test results approximately 7 days after the monitor has been mailed [facility] to [facility]. ?  ?Call [facility] Customer Care at [phone] if you have questions regarding your ZIO XT patch monitor.  Call them immediately if you see an orange light blinking on your monitor. ?  ?If your monitor falls off in less than 4 days contact our Monitor department at [phone].  If your monitor becomes loose or falls off after 4 days call [employer] at [phone] for suggestions on securing your monitor.  ? ?Follow-Up: ?[facility], you and your health needs are our priority.  As part of our continuing mission to provide you with exceptional heart care, we have created designated Provider Care Teams.  These Care Teams include your primary Cardiologist (physician) and Advanced Practice Providers  (APPs -  Physician Assistants and Nurse Practitioners) who all work together to provide you with the care you need, when you need it. ? ?Your next appointment:   ?AS NEEDED  ? ?The format for your next appoint

## 2022-03-03 NOTE — Patient Instructions (Signed)
Medication Instructions:  ?No Changes In Medications at this time.  ?*If you need a refill on your cardiac medications before your next appointment, please call your pharmacy* ? ?Testing/Procedures: ? ?ZIO XT- Long Term Monitor Instructions  ? ?Your physician has requested you wear your ZIO patch monitor___3____days.  ? ?This is a single patch monitor.  [employer] supplies one patch monitor per enrollment.  Additional stickers are not available. ?  ?Please do not apply patch if you will be having a Nuclear Stress Test, Echocardiogram, Cardiac CT, MRI, or Chest Xray during the time frame you would be wearing the monitor. The patch cannot be worn during these tests.  You cannot remove and re-apply the ZIO XT patch monitor. ?  ?Your ZIO patch monitor will be sent USPS Priority mail from [facility] directly to your home address. The monitor may also be mailed to a PO BOX if home delivery is not available.   It may take 3-5 days to receive your monitor after you have been enrolled. ?  ?Once you have received you monitor, please review enclosed instructions.  Your monitor has already been registered assigning a specific monitor serial # to you. ?  ?Applying the monitor  ? ?Shave hair from upper left chest. ?  ?Hold abrader disc by orange tab.  Rub abrader in 40 strokes over left upper chest as indicated in your monitor instructions. ?  ?Clean area with 4 enclosed alcohol pads .  Use all pads to assure are is cleaned thoroughly.  Let dry.  ? ?Apply patch as indicated in monitor instructions.  Patch will be place under collarbone on left side of chest with arrow pointing upward. ?  ?Rub patch adhesive wings for 2 minutes.Remove white label marked "1".  Remove white label marked "2".  Rub patch adhesive wings for 2 additional minutes. ?  ?While looking in a mirror, press and release button in center of patch.  A small green light will flash 3-4 times .  This will be your only indicator the monitor has been turned  on. ?    ?Do not shower for the first 24 hours.  You may shower after the first 24 hours. ?  ?Press button if you feel a symptom. You will hear a small click.  Record Date, Time and Symptom in the Patient Log Book. ?  ?When you are ready to remove patch, follow instructions on last 2 pages of Patient Log Book.  Stick patch monitor onto last page of Patient Log Book. ?  ?Place Patient Log Book in [facility] box.  Use locking tab on box and tape box closed securely.  The Orange and [employer] has [employer] on it.  Please place in mailbox as soon as possible.  Your physician should have your test results approximately 7 days after the monitor has been mailed back to [facility]. ?  ?Call [facility] at [phone] if you have questions regarding your ZIO XT patch monitor.  Call them immediately if you see an orange light blinking on your monitor. ?  ?If your monitor falls off in less than 4 days contact our Monitor department at [phone].  If your monitor becomes loose or falls off after 4 days call [employer] at [phone] for suggestions on securing your monitor.  ? ?Follow-Up: ?At [facility], you and your health needs are our priority.  As part of our continuing mission to provide you with exceptional heart care, we have created designated Provider Care Teams.  These Care Teams include your primary Cardiologist (physician) and Advanced Practice Providers (APPs -  Physician Assistants and Nurse Practitioners) who all work together to provide you with the care you need, when you need it. ? ?Your next appointment:   ?AS NEEDED  ? ?The format for your next appointment:   ?In Person ? ?Provider:   ?Janina Mayo, MD   ? ?

## 2022-03-03 NOTE — Progress Notes (Deleted)
?Cardiology Office Note:   ? ?Date:  03/03/2022  ? ?ID:  Stacey Fox, DOB June 03, 1981, MRN [id number] ? ?PCP:  Patient, No Pcp Per (Inactive) ?  ?[facility] Providers ?Cardiologist:  None { ?Click to update primary MD,subspecialty MD or APP then REFRESH:1}   ? ?Referring MD: Eustace Moore, MD  ? ?No chief complaint on file. ?*** ? ?History of Present Illness:   ? ?Stacey Fox is a 41 y.o. female with a hx of asthma, migraines,  ? ?Past Medical History:  ?Diagnosis Date  ? Asthma   ? exercise induced   ? Migraines   ? ? ?Past Surgical History:  ?Procedure Laterality Date  ? CESAREAN SECTION N/A 06/29/2020  ? Procedure: CESAREAN SECTION;  Surgeon: Shea Evans, MD;  Location: [facility];  Service: Obstetrics;  Laterality: N/A;  ? MOUTH SURGERY    ? ? ?Current Medications: ?No outpatient medications have been marked as taking for the 03/03/22 encounter (Appointment) with Maisie Fus, MD.  ?  ? ?Allergies:   Penicillins  ? ?Social History  ? ?Socioeconomic History  ? Marital status: Divorced  ?  Spouse name: Not on file  ? Number of children: Not on file  ? Years of education: Not on file  ? Highest education level: Not on file  ?Occupational History  ? Not on file  ?Tobacco Use  ? Smoking status: Never  ? Smokeless tobacco: Never  ?Vaping Use  ? Vaping Use: Never used  ?Substance and Sexual Activity  ? Alcohol use: Not Currently  ? Drug use: Not Currently  ? Sexual activity: Yes  ?Other Topics Concern  ? Not on file  ?Social History Narrative  ? Not on file  ? ?Social Determinants of Health  ? ?Financial Resource Strain: Not on file  ?Food Insecurity: Not on file  ?Transportation Needs: Not on file  ?Physical Activity: Not on file  ?Stress: Not on file  ?Social Connections: Not on file  ?  ? ?Family History: ?The patient's ***family history includes Cancer in her maternal grandfather and paternal grandfather; Diabetes in her paternal grandmother; Heart disease in her father; Hypertension in her father,  maternal grandfather, and paternal grandmother. ? ?ROS:   ?Please see the history of present illness.    ?*** All other systems reviewed and are negative. ? ?EKGs/Labs/Other Studies Reviewed:   ? ?The following studies were reviewed today: ?*** ? ?EKG:  EKG is *** ordered today.  The ekg ordered today demonstrates *** ? ?Recent Labs: ?02/16/2022: ALT 12; BUN 12; Creat 0.82; Hemoglobin 13.2; Platelets 191; Potassium 4.0; Sodium 138; TSH 1.90  ?Recent Lipid Panel ?No results found for: CHOL, TRIG, HDL, CHOLHDL, VLDL, LDLCALC, LDLDIRECT ? ? ?Risk Assessment/Calculations:   ?{Does this patient have ATRIAL FIBRILLATION?[phone]} ? ?    ? ?Physical Exam:   ? ?VS:  There were no vitals taken for this visit.   ? ?Wt Readings from Last 3 Encounters:  ?02/16/22 157 lb (71.2 kg)  ?12/09/20 155 lb (70.3 kg)  ?10/28/20 156 lb (70.8 kg)  ?  ? ?GEN: *** Well nourished, well developed in no acute distress ?HEENT: Normal ?NECK: No JVD; No carotid bruits ?LYMPHATICS: No lymphadenopathy ?CARDIAC: ***RRR, no murmurs, rubs, gallops ?RESPIRATORY:  Clear to auscultation without rales, wheezing or rhonchi  ?ABDOMEN: Soft, non-tender, non-distended ?MUSCULOSKELETAL:  No edema; No deformity  ?SKIN: Warm and dry ?NEUROLOGIC:  Alert and oriented x 3 ?PSYCHIATRIC:  Normal affect  ? ?ASSESSMENT:   ? ?No diagnosis  found. ?PLAN:   ? ?In order of problems listed above: ? ?*** ? ?   ? ?{Are you ordering a CV Procedure (e.g. stress test, cath, DCCV, TEE, etc)?   Press F2        :[id number]}  ? ? ?Medication Adjustments/Labs and Tests Ordered: ?Current medicines are reviewed at length with the patient today.  Concerns regarding medicines are outlined above.  ?No orders of the defined types were placed in this encounter. ? ?No orders of the defined types were placed in this encounter. ? ? ?There are no Patient Instructions on file for this visit.  ? ?Signed, ?Maisie Fus, MD  ?03/03/2022 8:15 AM    ?[facility] ?

## 2022-03-03 NOTE — Progress Notes (Signed)
?Cardiology Office Note:   ? ?Date:  03/03/2022  ? ?ID:  Stacey Fox, DOB Mar 03, 1981, MRN [id number] ? ?PCP:  Patient, No Pcp Per (Inactive) ?  ?[facility] Providers ?Cardiologist:  Maisie Fus, MD    ? ?Referring MD: Eustace Moore, MD  ? ?No chief complaint on file. ?Palpitations ? ?History of Present Illness:   ? ?Stacey Fox is a 41 y.o. female with no hx of cardiac disease, asthma, migraines ? ?She went to the urgent care 3/9 for palpitations and LH. Her Ekg was normal. Her blood pressures were normal.  She feels extra beats. She felt heart racing. She can feel jittery and edgy feeling.  Has not happened before. She was not dehydrated.  No smoking. 1 cup of coffee a day. No recent syncopal episodes. Father had CABG. He was in his early 38s. ? ?TSH is normal. ? ?She has no CVD risk factors. ? ?EKG 02/16/2022-NSR ? ?Past Medical History:  ?Diagnosis Date  ? Asthma   ? exercise induced   ? Migraines   ? ? ?Past Surgical History:  ?Procedure Laterality Date  ? CESAREAN SECTION N/A 06/29/2020  ? Procedure: CESAREAN SECTION;  Surgeon: Shea Evans, MD;  Location: [facility];  Service: Obstetrics;  Laterality: N/A;  ? MOUTH SURGERY    ? ? ?Current Medications: ?Current Meds  ?Medication Sig  ? Probiotic Product (PROBIOTIC PO) Take 2 capsules by mouth daily.  ?  ? ?Allergies:   Penicillins  ? ?Social History  ? ?Socioeconomic History  ? Marital status: Divorced  ?  Spouse name: Not on file  ? Number of children: Not on file  ? Years of education: Not on file  ? Highest education level: Not on file  ?Occupational History  ? Not on file  ?Tobacco Use  ? Smoking status: Never  ? Smokeless tobacco: Never  ?Vaping Use  ? Vaping Use: Never used  ?Substance and Sexual Activity  ? Alcohol use: Not Currently  ? Drug use: Not Currently  ? Sexual activity: Yes  ?Other Topics Concern  ? Not on file  ?Social History Narrative  ? Not on file  ? ?Social Determinants of Health  ? ?Financial Resource Strain: Not on file   ?Food Insecurity: Not on file  ?Transportation Needs: Not on file  ?Physical Activity: Not on file  ?Stress: Not on file  ?Social Connections: Not on file  ?  ? ?Family History: ?The patient's family history includes Cancer in her maternal grandfather and paternal grandfather; Diabetes in her paternal grandmother; Heart disease in her father; Hypertension in her father, maternal grandfather, and paternal grandmother. ? ?ROS:   ?Please see the history of present illness.    ? All other systems reviewed and are negative. ? ?EKGs/Labs/Other Studies Reviewed:   ? ?The following studies were reviewed today: ? ? ?EKG:  EKG is  ordered today.  The ekg ordered today demonstrates  ? ?NSR ? ?Recent Labs: ?02/16/2022: ALT 12; BUN 12; Creat 0.82; Hemoglobin 13.2; Platelets 191; Potassium 4.0; Sodium 138; TSH 1.90  ?Recent Lipid Panel ?No results found for: CHOL, TRIG, HDL, CHOLHDL, VLDL, LDLCALC, LDLDIRECT ? ? ?Risk Assessment/Calculations:   ?  ? ?    ? ?Physical Exam:   ? ?VS:  BP 100/62   Pulse 77   Ht 5\' 2"  (1.575 m)   Wt 159 lb 3.2 oz (72.2 kg)   SpO2 98%   BMI 29.12 kg/m?    ? ?Wt Readings from  Last 3 Encounters:  ?03/03/22 159 lb 3.2 oz (72.2 kg)  ?02/16/22 157 lb (71.2 kg)  ?12/09/20 155 lb (70.3 kg)  ?  ? ?GEN:  Well nourished, well developed in no acute distress ?HEENT: Normal ?NECK: No JVD; No carotid bruits ?LYMPHATICS: No lymphadenopathy ?CARDIAC: RRR, no murmurs, rubs, gallops ?RESPIRATORY:  Clear to auscultation without rales, wheezing or rhonchi  ?ABDOMEN: Soft, non-tender, non-distended ?MUSCULOSKELETAL:  No edema; No deformity  ?SKIN: Warm and dry ?NEUROLOGIC:  Alert and oriented x 3 ?PSYCHIATRIC:  Normal affect  ? ?ASSESSMENT:   ? ?Palpitations: Patient notes palpitations at night that have persisted. She is concerned and would like to pursue monitoring. Discussed doing 3 day ziopatch. Her EKG is normal in the ED and here. She does not have high risk features including syncope c/f arrhythmia , family hx  of SCD, or abnormalities on her EKG.  ? ?PLAN:   ? ?In order of problems listed above: ? ?Ziopatch  ?Follow up as needed ? ?   ? ?Medication Adjustments/Labs and Tests Ordered: ?Current medicines are reviewed at length with the patient today.  Concerns regarding medicines are outlined above.  ?Orders Placed This Encounter  ?Procedures  ? LONG TERM MONITOR (3-14 DAYS)  ? EKG 12-Lead  ? ?No orders of the defined types were placed in this encounter. ? ? ?Patient Instructions  ?Medication Instructions:  ?No Changes In Medications at this time.  ?*If you need a refill on your cardiac medications before your next appointment, please call your pharmacy* ? ?Testing/Procedures: ? ?ZIO XT- Long Term Monitor Instructions  ? ?Your physician has requested you wear your ZIO patch monitor___3____days.  ? ?This is a single patch monitor.  [employer] supplies one patch monitor per enrollment.  Additional stickers are not available. ?  ?Please do not apply patch if you will be having a Nuclear Stress Test, Echocardiogram, Cardiac CT, MRI, or Chest Xray during the time frame you would be wearing the monitor. The patch cannot be worn during these tests.  You cannot remove and re-apply the ZIO XT patch monitor. ?  ?Your ZIO patch monitor will be [facility] USPS Priority [facility] to your home address. The monitor may also be mailed to a PO BOX if home delivery is not available.   It may take 3-5 days to receive your monitor after you have been enrolled. ?  ?Once you have received you monitor, please review enclosed instructions.  Your monitor has already been registered assigning a specific monitor serial # to you. ?  ?Applying the monitor  ? ?Shave hair from upper left chest. ?  ?Hold abrader disc by orange tab.  Rub abrader in 40 strokes over left upper chest as indicated in your monitor instructions. ?  ?Clean area with 4 enclosed alcohol pads .  Use all pads to assure are is cleaned thoroughly.  Let dry.   ? ?Apply patch as indicated in monitor instructions.  Patch will be place under collarbone on left side of chest with arrow pointing upward. ?  ?Rub patch adhesive wings for 2 minutes.Remove white label marked "1".  Remove white label marked "2".  Rub patch adhesive wings for 2 additional minutes. ?  ?While looking in a mirror, press and release button in center of patch.  A small green light will flash 3-4 times .  This will be your only indicator the monitor has been turned on. ?    ?Do not shower for the first 24 hours.  You may shower after the first 24 hours. ?  ?Press button if you feel a symptom. You will hear a small click.  Record Date, Time and Symptom in the Patient Log Book. ?  ?When you are ready to remove patch, follow instructions on last 2 pages of Patient Log Book.  Stick patch monitor onto last page of Patient Log Book. ?  ?Place Patient Log Book in [facility] box.  Use locking tab on box and tape box closed securely.  The [employer] has [employer] on it.  Please place in mailbox as soon as possible.  Your physician should have your test results approximately 7 days after the monitor has been mailed back to [facility]. ?  ?Call [facility] at [phone] if you have questions regarding your ZIO XT patch monitor.  Call them immediately if you see an orange light blinking on your monitor. ?  ?If your monitor falls off in less than 4 days contact our Monitor department at [phone].  If your monitor becomes loose or falls off after 4 days call [employer] at [phone] for suggestions on securing your monitor.  ? ?Follow-Up: ?At [facility], you and your health needs are our priority.  As part of our continuing mission to provide you with exceptional heart care, we have created designated Provider Care Teams.  These Care Teams include your primary Cardiologist (physician) and Advanced Practice Providers (APPs -  Physician Assistants and Nurse Practitioners) who  all work together to provide you with the care you need, when you need it. ? ?Your next appointment:   ?AS NEEDED  ? ?The format for your next appointment:   ?In Person ? ?Provider:   ?Maisie Fus, MD   ?  ? ?Signed

## 2022-03-03 NOTE — Progress Notes (Unsigned)
Enrolled patient for a 3 day Zio XT monitor to be mailed to patients home  

## 2022-03-08 DIAGNOSIS — R002 Palpitations: Secondary | ICD-10-CM | POA: Diagnosis not present

## 2024-03-20 ENCOUNTER — Other Ambulatory Visit: Payer: Self-pay | Admitting: Family Medicine

## 2024-03-20 DIAGNOSIS — Z1231 Encounter for screening mammogram for malignant neoplasm of breast: Secondary | ICD-10-CM

## 2024-04-09 ENCOUNTER — Ambulatory Visit
Admission: RE | Admit: 2024-04-09 | Discharge: 2024-04-09 | Discharge status: Home / Self Care | Source: Ambulatory Visit | Attending: Family Medicine | Admitting: Family Medicine

## 2024-04-09 DIAGNOSIS — Z1231 Encounter for screening mammogram for malignant neoplasm of breast: Secondary | ICD-10-CM
